# Patient Record
Sex: Female | Born: 1975 | Race: White | Hispanic: No | Smoking: Never smoker
Health system: Southern US, Community
[De-identification: ages and names within clinical notes are randomized; demographics above are authoritative.]

## PROBLEM LIST (undated history)

## (undated) DIAGNOSIS — G8929 Other chronic pain: Secondary | ICD-10-CM

## (undated) DIAGNOSIS — M549 Dorsalgia, unspecified: Secondary | ICD-10-CM

## (undated) DIAGNOSIS — R519 Headache, unspecified: Secondary | ICD-10-CM

## (undated) DIAGNOSIS — N200 Calculus of kidney: Secondary | ICD-10-CM

## (undated) DIAGNOSIS — M1990 Unspecified osteoarthritis, unspecified site: Secondary | ICD-10-CM

## (undated) DIAGNOSIS — H9319 Tinnitus, unspecified ear: Secondary | ICD-10-CM

## (undated) HISTORY — DX: Tinnitus, unspecified ear: H93.19

## (undated) HISTORY — DX: Calculus of kidney: N20.0

## (undated) HISTORY — DX: Headache, unspecified: R51.9

## (undated) HISTORY — PX: CHOLECYSTECTOMY: SHX55

## (undated) HISTORY — PX: BREAST SURGERY: SHX581

---

## 2016-02-23 DIAGNOSIS — K439 Ventral hernia without obstruction or gangrene: Secondary | ICD-10-CM | POA: Insufficient documentation

## 2016-10-21 DIAGNOSIS — R7989 Other specified abnormal findings of blood chemistry: Secondary | ICD-10-CM | POA: Insufficient documentation

## 2016-10-21 DIAGNOSIS — M47819 Spondylosis without myelopathy or radiculopathy, site unspecified: Secondary | ICD-10-CM | POA: Insufficient documentation

## 2016-10-21 DIAGNOSIS — M539 Dorsopathy, unspecified: Secondary | ICD-10-CM | POA: Insufficient documentation

## 2016-11-20 ENCOUNTER — Other Ambulatory Visit: Payer: Self-pay | Admitting: Pulmonary Disease

## 2016-11-20 DIAGNOSIS — M545 Low back pain, unspecified: Secondary | ICD-10-CM

## 2016-11-20 DIAGNOSIS — M542 Cervicalgia: Secondary | ICD-10-CM

## 2016-11-20 DIAGNOSIS — M546 Pain in thoracic spine: Secondary | ICD-10-CM

## 2016-11-20 DIAGNOSIS — R29898 Other symptoms and signs involving the musculoskeletal system: Secondary | ICD-10-CM

## 2016-11-28 ENCOUNTER — Ambulatory Visit
Admission: RE | Admit: 2016-11-28 | Discharge: 2016-11-28 | Disposition: A | Discharge status: Home / Self Care | Payer: Self-pay | Source: Ambulatory Visit | Attending: Pulmonary Disease | Admitting: Pulmonary Disease

## 2016-11-28 DIAGNOSIS — M545 Low back pain, unspecified: Secondary | ICD-10-CM

## 2016-11-28 DIAGNOSIS — R29898 Other symptoms and signs involving the musculoskeletal system: Secondary | ICD-10-CM

## 2016-12-04 ENCOUNTER — Ambulatory Visit
Admission: RE | Admit: 2016-12-04 | Discharge: 2016-12-04 | Disposition: A | Discharge status: Home / Self Care | Payer: BLUE CROSS/BLUE SHIELD | Source: Ambulatory Visit | Attending: Pulmonary Disease | Admitting: Pulmonary Disease

## 2016-12-04 DIAGNOSIS — M546 Pain in thoracic spine: Secondary | ICD-10-CM

## 2016-12-04 DIAGNOSIS — M542 Cervicalgia: Secondary | ICD-10-CM

## 2017-01-23 DIAGNOSIS — N921 Excessive and frequent menstruation with irregular cycle: Secondary | ICD-10-CM | POA: Insufficient documentation

## 2017-01-23 DIAGNOSIS — N840 Polyp of corpus uteri: Secondary | ICD-10-CM | POA: Insufficient documentation

## 2017-04-09 DIAGNOSIS — K219 Gastro-esophageal reflux disease without esophagitis: Secondary | ICD-10-CM | POA: Insufficient documentation

## 2017-04-30 DIAGNOSIS — K648 Other hemorrhoids: Secondary | ICD-10-CM | POA: Insufficient documentation

## 2017-05-01 DIAGNOSIS — H5213 Myopia, bilateral: Secondary | ICD-10-CM | POA: Insufficient documentation

## 2017-05-22 DIAGNOSIS — R768 Other specified abnormal immunological findings in serum: Secondary | ICD-10-CM | POA: Insufficient documentation

## 2017-05-22 DIAGNOSIS — B948 Sequelae of other specified infectious and parasitic diseases: Secondary | ICD-10-CM | POA: Insufficient documentation

## 2017-06-24 DIAGNOSIS — G8929 Other chronic pain: Secondary | ICD-10-CM | POA: Insufficient documentation

## 2017-06-24 DIAGNOSIS — R6889 Other general symptoms and signs: Secondary | ICD-10-CM | POA: Insufficient documentation

## 2017-11-05 DIAGNOSIS — S8002XA Contusion of left knee, initial encounter: Secondary | ICD-10-CM | POA: Insufficient documentation

## 2017-11-05 DIAGNOSIS — S0081XA Abrasion of other part of head, initial encounter: Secondary | ICD-10-CM | POA: Insufficient documentation

## 2017-11-05 DIAGNOSIS — Y990 Civilian activity done for income or pay: Secondary | ICD-10-CM | POA: Insufficient documentation

## 2017-11-05 DIAGNOSIS — S0003XA Contusion of scalp, initial encounter: Secondary | ICD-10-CM | POA: Insufficient documentation

## 2017-11-07 DIAGNOSIS — M25462 Effusion, left knee: Secondary | ICD-10-CM | POA: Insufficient documentation

## 2017-11-19 DIAGNOSIS — S83419A Sprain of medial collateral ligament of unspecified knee, initial encounter: Secondary | ICD-10-CM | POA: Insufficient documentation

## 2017-11-19 DIAGNOSIS — S83519A Sprain of anterior cruciate ligament of unspecified knee, initial encounter: Secondary | ICD-10-CM | POA: Insufficient documentation

## 2017-12-26 DIAGNOSIS — N6310 Unspecified lump in the right breast, unspecified quadrant: Secondary | ICD-10-CM | POA: Insufficient documentation

## 2017-12-26 DIAGNOSIS — R9230 Dense breasts, unspecified: Secondary | ICD-10-CM | POA: Insufficient documentation

## 2018-01-01 DIAGNOSIS — M25562 Pain in left knee: Secondary | ICD-10-CM | POA: Insufficient documentation

## 2018-02-06 DIAGNOSIS — M228X2 Other disorders of patella, left knee: Secondary | ICD-10-CM | POA: Insufficient documentation

## 2018-03-26 DIAGNOSIS — Z86010 Personal history of colonic polyps: Secondary | ICD-10-CM | POA: Insufficient documentation

## 2018-03-26 DIAGNOSIS — M2242 Chondromalacia patellae, left knee: Secondary | ICD-10-CM | POA: Insufficient documentation

## 2019-07-21 DIAGNOSIS — L509 Urticaria, unspecified: Secondary | ICD-10-CM | POA: Insufficient documentation

## 2020-04-03 DIAGNOSIS — L659 Nonscarring hair loss, unspecified: Secondary | ICD-10-CM | POA: Insufficient documentation

## 2020-04-03 DIAGNOSIS — N9089 Other specified noninflammatory disorders of vulva and perineum: Secondary | ICD-10-CM | POA: Insufficient documentation

## 2020-09-13 DIAGNOSIS — N200 Calculus of kidney: Secondary | ICD-10-CM | POA: Insufficient documentation

## 2021-01-16 DIAGNOSIS — K130 Diseases of lips: Secondary | ICD-10-CM | POA: Insufficient documentation

## 2021-02-03 ENCOUNTER — Emergency Department (HOSPITAL_BASED_OUTPATIENT_CLINIC_OR_DEPARTMENT_OTHER): Payer: BC Managed Care – PPO

## 2021-02-03 ENCOUNTER — Other Ambulatory Visit: Payer: Self-pay

## 2021-02-03 ENCOUNTER — Encounter (HOSPITAL_BASED_OUTPATIENT_CLINIC_OR_DEPARTMENT_OTHER): Payer: Self-pay | Admitting: Emergency Medicine

## 2021-02-03 ENCOUNTER — Emergency Department (HOSPITAL_BASED_OUTPATIENT_CLINIC_OR_DEPARTMENT_OTHER)
Admission: EM | Admit: 2021-02-03 | Discharge: 2021-02-03 | Disposition: A | Discharge status: Home / Self Care | Payer: BC Managed Care – PPO | Attending: Emergency Medicine | Admitting: Emergency Medicine

## 2021-02-03 DIAGNOSIS — M79662 Pain in left lower leg: Secondary | ICD-10-CM | POA: Diagnosis present

## 2021-02-03 DIAGNOSIS — U071 COVID-19: Secondary | ICD-10-CM | POA: Insufficient documentation

## 2021-02-03 DIAGNOSIS — R000 Tachycardia, unspecified: Secondary | ICD-10-CM | POA: Diagnosis not present

## 2021-02-03 DIAGNOSIS — M79605 Pain in left leg: Secondary | ICD-10-CM

## 2021-02-03 HISTORY — DX: Unspecified osteoarthritis, unspecified site: M19.90

## 2021-02-03 LAB — BASIC METABOLIC PANEL
Anion gap: 9 (ref 5–15)
BUN: 16 mg/dL (ref 6–20)
CO2: 25 mmol/L (ref 22–32)
Calcium: 9.3 mg/dL (ref 8.9–10.3)
Chloride: 103 mmol/L (ref 98–111)
Creatinine, Ser: 0.57 mg/dL (ref 0.44–1.00)
GFR, Estimated: >60 mL/min (ref >60)
Glucose, Bld: 126 mg/dL — ABNORMAL HIGH (ref 70–99)
Potassium: 3.4 mmol/L — ABNORMAL LOW (ref 3.5–5.1)
Sodium: 137 mmol/L (ref 135–145)

## 2021-02-03 LAB — CBC
HCT: 38.7 % (ref 36.0–46.0)
Hemoglobin: 13.7 g/dL (ref 12.0–15.0)
MCH: 32 pg (ref 26.0–34.0)
MCHC: 35.4 g/dL (ref 30.0–36.0)
MCV: 90.4 fL (ref 80.0–100.0)
Platelets: 236 10*3/uL (ref 150–400)
RBC: 4.28 MIL/uL (ref 3.87–5.11)
RDW: 12 % (ref 11.5–15.5)
WBC: 6.3 10*3/uL (ref 4.0–10.5)
nRBC: 0 % (ref 0.0–0.2)

## 2021-02-03 NOTE — ED Triage Notes (Signed)
Pt arrives pov with c/o L calf pain x 2 days. Pt endorses taking K+ daily x 1 week. Pt denies injury, denies swelling. Pt took 4 baby aspirin today

## 2021-02-03 NOTE — Discharge Instructions (Addendum)
Use Tylenol or ibuprofen as needed for leg pain. If you continue to have leg pain after you are done with quarantine and your COVID symptoms resolved, follow-up with your primary care doctor for reevaluation. Continue treating your COVID symptoms with over the counter medications as needed. Return to the emergency room with any new, worsening or concerning symptoms.

## 2021-02-03 NOTE — ED Provider Notes (Signed)
[facility] EMERGENCY DEPARTMENT Provider Note   CSN: [id number] Arrival date & time: 02/03/21  1204     History Chief Complaint  Patient presents with  . Leg Pain    Jaime Burch is a 45 y.o. female presenting for evaluation of left calf pain.  Patient states few days ago she started to become ill with a viral illness.  She tested positive for COVID yesterday.  Last night, she was laying in bed when she had acute onset left calf pain.  Since then, pain has been constant, increased discomfort when she dorsiflexes the foot.  No worsening pain with ambulation.  She denies history of similar.  No symptoms on the right side.  She denies previous blood clot, but states that her mom has had them before.  She is not on blood thinners.  No numbness or tingling in the foot.  She does report a history of electrolyte abnormalities, is taking potassium daily. Pt states she is here solely for evaluation of her calf pain, not her recent covid infection.   HPI     Past Medical History:  Diagnosis Date  . Arthritis     There are no problems to display for this patient.   Past Surgical History:  Procedure Laterality Date  . BREAST SURGERY    . CESAREAN SECTION    . CHOLECYSTECTOMY       OB History   No obstetric history on file.     History reviewed. No pertinent family history.  Social History   Tobacco Use  . Smoking status: Never Smoker  Substance Use Topics  . Alcohol use: Never  . Drug use: Never    Home Medications Prior to Admission medications   Not on File    Allergies    Morphine  Review of Systems   Review of Systems  Constitutional: Positive for fever.  HENT: Positive for congestion.   Respiratory: Positive for cough.   Musculoskeletal: Positive for myalgias.  All other systems reviewed and are negative.   Physical Exam Updated Vital Signs BP 130/88 (BP Location: Right Arm)   Pulse (!) 119   Temp 99.5 F (37.5 C) (Oral)   Resp 18    Ht 5\' 4"  (1.626 m)   Wt 54.4 kg   LMP 01/27/2021 (LMP Unknown)   SpO2 99%   BMI 20.60 kg/m   Physical Exam Vitals and nursing note reviewed.  Constitutional:      General: She is not in acute distress.    Appearance: She is well-developed.  HENT:     Head: Normocephalic and atraumatic.  Eyes:     Conjunctiva/sclera: Conjunctivae normal.     Pupils: Pupils are equal, round, and reactive to light.  Cardiovascular:     Rate and Rhythm: Regular rhythm. Tachycardia present.     Pulses: Normal pulses.  Pulmonary:     Effort: Pulmonary effort is normal. No respiratory distress.     Breath sounds: Normal breath sounds. No wheezing.  Abdominal:     General: There is no distension.     Palpations: Abdomen is soft. There is no mass.     Tenderness: There is no abdominal tenderness. There is no guarding or rebound.  Musculoskeletal:        General: Tenderness present. Normal range of motion.     Cervical back: Normal range of motion and neck supple.     Comments: Mild tenderness palpation of left calf.  No significant swelling.  No erythema or warmth.  Pedal pulses 2+ bilaterally.  Positive Homans on the left side.  No chest palpation of the popliteal area or upper left leg  Skin:    General: Skin is warm and dry.  Neurological:     Mental Status: She is alert and oriented to person, place, and time.     ED Results / Procedures / Treatments   Labs (all labs ordered are listed, but only abnormal results are displayed) Labs Reviewed  BASIC METABOLIC PANEL  CBC  PREGNANCY, URINE    EKG None  Radiology No results found.  Procedures Procedures   Medications Ordered in ED Medications - No data to display  ED Course  I have reviewed the triage vital signs and the nursing notes.  Pertinent labs & imaging results that were available during my care of the patient were reviewed by me and considered in my medical decision making (see chart for details).    MDM  Rules/Calculators/A&P                          Presenting for evaluation of left leg pain.  She was also recently found to be COVID-positive, but is not requesting evaluation for this.  On exam, patient appears nontoxic.  No erythema or warmth.  Mild calf tenderness.  Neurovascularly intact.  Consider DVT vs generalized body aches from infection.  Consider electrolyte abnormality.  Labs and ultrasound ordered.  Labs interpreted by me, overall reassuring.  Mild hypokalemia of 3.4, patient is already taking potassium supplementation.  Ultrasound negative for DVT.  Discussed findings with patient.  Discussed continued symptomatic treatment, follow-up with PCP as needed if pain persist after COVID infection resolves.  At this time, patient appears safe for discharge.  Return precautions given.  Patient states she understands and agrees to plan.  Final Clinical Impression(s) / ED Diagnoses Final diagnoses:  None    Rx / DC Orders ED Discharge Orders    None       Alveria Apley, PA-C 02/03/21 1533    Terrilee Files, MD 02/03/21 [phone]

## 2021-04-11 ENCOUNTER — Ambulatory Visit
Admission: EM | Admit: 2021-04-11 | Discharge: 2021-04-11 | Disposition: A | Discharge status: Home / Self Care | Payer: BC Managed Care – PPO | Attending: Emergency Medicine | Admitting: Emergency Medicine

## 2021-04-11 ENCOUNTER — Encounter: Payer: Self-pay | Admitting: Emergency Medicine

## 2021-04-11 ENCOUNTER — Other Ambulatory Visit: Payer: Self-pay

## 2021-04-11 DIAGNOSIS — H6981 Other specified disorders of Eustachian tube, right ear: Secondary | ICD-10-CM

## 2021-04-11 HISTORY — DX: Other chronic pain: G89.29

## 2021-04-11 HISTORY — DX: Other chronic pain: M54.9

## 2021-04-11 MED ORDER — AZELASTINE HCL 0.1 % NA SOLN: 2.0000 | Freq: Two times a day (BID) | NASAL | 0 refills | Status: DC

## 2021-04-11 NOTE — ED Triage Notes (Signed)
Patient c/o RT ear pain x 2 weeks.   Patient denies fever at home.   Patient endorses worsening pain with now RT sided facial pain.   Patient endorses ringing in RT ear.   Patient has used Nasonex and nasal saline with some relief of symptoms.

## 2021-04-11 NOTE — Discharge Instructions (Signed)
Follow up with PCP within the next 1 week if symptoms do not improve.

## 2021-04-11 NOTE — ED Provider Notes (Signed)
Chief Complaint   Chief Complaint  Patient presents with   Otalgia     Subjective, HPI  Jaime Burch is a very pleasant 45 y.o. female who presents with right ear pain for the last 2 weeks.  Patient states that she has also noticed some right-sided facial discomfort and ringing to the right ear.  Patient states that the pain is "aggravating".  Patient states that she has been using Nasacort and saline with some mild relief of symptoms.  No fever or recent illness reported.  No additional symptoms today.  Patient's problem list, past medical and social history, medications, and allergies were reviewed by me and updated in Epic.   ROS  See HPI.  Objective   Vitals:   04/11/21 1054  BP: 124/73  Pulse: (!) 110  Resp: 15  Temp: 99.2 F (37.3 C)  SpO2: 98%     General: Appears well-developed and well-nourished. No acute distress.  HEENT Head: Normocephalic and atraumatic.  Eyes: Conjunctivae and EOM are normal. No eye drainage or scleral icterus bilaterally.  Ears: Bilateral: Hearing grossly intact. No drainage or visible deformity. No mastoid erythema, edema, or tenderness.  Right: TM bulging with clear effusion. Left: WNL Nose: No nasal deviation. No rhinorrhea.  Mouth/Throat: No stridor or tracheal deviation. No posterior oropharyngeal erythema, edema, or exudate.  Neck: Normal range of motion, neck is supple. No cervical, tonsillar, or submandibular lymphadenopathy.  Cardiovascular: Normal rate  Pulm/Chest: No respiratory distress. No accessory muscle usage, speaking in full sentences.  Skin: Skin is warm and dry.    Vital signs and nursing note reviewed.    Assessment & Plan  1. Acute dysfunction of right eustachian tube  Meds ordered this encounter  Medications   azelastine (ASTELIN) 0.1 % nasal spray    Sig: Place 2 sprays into both nostrils 2 (two) times daily. Use in each nostril as directed    Dispense:  30 mL    Refill:  0    Order Specific Question:    Supervising Provider    Answer:   Merrilee Jansky [[id number]]    45 y.o. female presents with right ear pain for the last 2 weeks.  Patient states that she has also noticed some right-sided facial discomfort and ringing to the right ear.  Patient states that the pain is "aggravating".  Patient states that she has been using Nasacort and saline with some mild relief of symptoms.  No fever or recent illness reported.  No additional symptoms today.  Chart review completed.  Given symptoms along with assessment findings, likely eustachian tube dysfunction.  Rx'd Astelin to the patient's preferred pharmacy and advised about home treatment and care as outlined in her AVS.  Return as needed.  Patient verbalized understanding and agreed with plan.  Stable on discharge.  Plan:   Discharge Instructions      Follow up with PCP within the next 1 week if symptoms do not improve.        Amalia Greenhouse, FNP-C 04/11/21  This note was partially made with the aid of speech-to-text dictation; typographical errors are not intentional.    Amalia Greenhouse, FNP 04/11/21 1205

## 2021-04-25 DIAGNOSIS — G500 Trigeminal neuralgia: Secondary | ICD-10-CM | POA: Insufficient documentation

## 2021-04-25 DIAGNOSIS — H9313 Tinnitus, bilateral: Secondary | ICD-10-CM | POA: Insufficient documentation

## 2021-04-25 DIAGNOSIS — H9203 Otalgia, bilateral: Secondary | ICD-10-CM | POA: Insufficient documentation

## 2021-05-29 ENCOUNTER — Encounter: Payer: Self-pay | Admitting: Neurology

## 2021-05-29 ENCOUNTER — Ambulatory Visit (INDEPENDENT_AMBULATORY_CARE_PROVIDER_SITE_OTHER): Payer: BC Managed Care – PPO | Admitting: Neurology

## 2021-05-29 ENCOUNTER — Other Ambulatory Visit: Payer: Self-pay

## 2021-05-29 VITALS — BP 108/65 | HR 84 | Ht 64.0 in

## 2021-05-29 DIAGNOSIS — G43709 Chronic migraine without aura, not intractable, without status migrainosus: Secondary | ICD-10-CM | POA: Diagnosis not present

## 2021-05-29 MED ORDER — SUMATRIPTAN SUCCINATE 50 MG PO TABS: 50.0000 mg | ORAL_TABLET | ORAL | 6 refills | Status: DC | PRN

## 2021-05-29 MED ORDER — NORTRIPTYLINE HCL 25 MG PO CAPS: 50.0000 mg | ORAL_CAPSULE | Freq: Every day | ORAL | 11 refills | Status: DC

## 2021-05-29 MED ORDER — ONDANSETRON 4 MG PO TBDP: 4.0000 mg | ORAL_TABLET | Freq: Three times a day (TID) | ORAL | 6 refills | Status: DC | PRN

## 2021-05-29 NOTE — Patient Instructions (Signed)
Meds ordered this encounter  Medications   nortriptyline (PAMELOR) 25 MG capsule    Sig: Take 2 capsules (50 mg total) by mouth at bedtime.    Dispense:  60 capsule    Refill:  11   SUMAtriptan (IMITREX) 50 MG tablet    Sig: Take 1 tablet (50 mg total) by mouth every 2 (two) hours as needed for migraine. May repeat in 2 hours if headache persists or recurs.    Dispense:  12 tablet    Refill:  6   ondansetron (ZOFRAN ODT) 4 MG disintegrating tablet    Sig: Take 1 tablet (4 mg total) by mouth every 8 (eight) hours as needed for nausea or vomiting.    Dispense:  20 tablet    Refill:  6     You may combine imitrex 50mg  as needed, with zofran 4mg  +Aleve as needed for severe prolonged headaches.

## 2021-05-29 NOTE — Progress Notes (Signed)
Chief Complaint  Patient presents with   New Patient (Initial Visit)    Pt here to discuss trigeminal neuralgia, right facial pain, new room, alone, no additional questions       ASSESSMENT AND PLAN  Neiva Maenza is a 45 y.o. female   Right side facial pain, most consistent with migraine headaches  Significant improvement with amitriptyline 25 mg, will change to nortriptyline titrating to 50 mg every night as preventive medications  Imitrex 50 mg as needed, may combine with Zofran Aleve for more severe prolonged headaches  DIAGNOSTIC DATA (LABS, IMAGING, TESTING) - I reviewed patient records, labs, notes, testing and imaging myself where available.   MEDICAL HISTORY:  Glendy Barsanti, is a 45 year old female, seen in request by her primary care doctor [name].  for evaluation of right facial pain, initial evaluation was on May 29, 2021  I reviewed and summarized the referring note. PMHX. Kidney stone.  She denies a previous history of migraine headaches, on February 24, 2021, she woke up noticed sore throat, ear pain, and right side pressure, 2 weeks later, she felt more dull achy pain at the right face, felt heavy on the right side, also felt right arm, leg numbness, she drove herself to urgent care, had MRI of the brain with without contrast May 09, 2021, no significant abnormality, few small vessel disease, vascular loop abuts the mid cisternal segment of the right trigeminal nerve, and right cochlear nerve  Since then, she had intermittent right facial abnormal sensation, but denies radiating pain, she also describes right-sided headache with nausea, light noise sensitivity.  She had a history of shingles, involving right C1-C2  PHYSICAL EXAM:   Vitals:   05/29/21 0806  BP: 108/65  Pulse: 84  Height: 5\' 4"  (1.626 m)   Not recorded     Body mass index is 20.6 kg/m.  PHYSICAL EXAMNIATION:  Gen: NAD, conversant, well nourised, well groomed                      Cardiovascular: Regular rate rhythm, no peripheral edema, warm, nontender. Eyes: Conjunctivae clear without exudates or hemorrhage Neck: Supple, no carotid bruits. Pulmonary: Clear to auscultation bilaterally   NEUROLOGICAL EXAM:  MENTAL STATUS: Speech:    Speech is normal; fluent and spontaneous with normal comprehension.  Cognition:     Orientation to time, place and person     Normal recent and remote memory     Normal Attention span and concentration     Normal Language, naming, repeating,spontaneous speech     Fund of knowledge   CRANIAL NERVES: CN II: Visual fields are full to confrontation. Pupils are round equal and briskly reactive to light. CN III, IV, VI: extraocular movement are normal. No ptosis. CN V: Facial sensation is intact to light touch CN VII: Face is symmetric with normal eye closure  CN VIII: Hearing is normal to causal conversation. CN IX, X: Phonation is normal. CN XI: Head turning and shoulder shrug are intact  MOTOR: There is no pronator drift of out-stretched arms. Muscle bulk and tone are normal. Muscle strength is normal.  REFLEXES: Reflexes are 2+ and symmetric at the biceps, triceps, knees, and ankles. Plantar responses are flexor.  SENSORY: Intact to light touch, pinprick and vibratory sensation are intact in fingers and toes.  COORDINATION: There is no trunk or limb dysmetria noted.  GAIT/STANCE: Posture is normal. Gait is steady with normal steps, base, arm swing, and turning. Heel and  toe walking are normal. Tandem gait is normal.  Romberg is absent.  REVIEW OF SYSTEMS:  Full 14 system review of systems performed and notable only for as above All other review of systems were negative.   ALLERGIES: Allergies  Allergen Reactions   Morphine Other (See Comments), Shortness Of Breath and Palpitations    Other reaction(s): Hypertension (intolerance) Tachycardia, SOB Tachycardia, SOB     HOME MEDICATIONS: Current  Outpatient Medications  Medication Sig Dispense Refill   amitriptyline (ELAVIL) 25 MG tablet Take 25 mg by mouth daily.     chlorthalidone (HYGROTON) 25 MG tablet Take 25 mg by mouth daily.     Cholecalciferol 50 MCG (2000 UT) TABS Take by mouth.     Omega-3 Fatty Acids (FISH OIL PO) Take by mouth.     Potassium Bicarbonate (KLOR-CON/EF PO)      Probiotic Product (PROBIOTIC ADVANCED PO) Take by mouth.     No current facility-administered medications for this visit.    PAST MEDICAL HISTORY: Past Medical History:  Diagnosis Date   Arthritis    Chronic back pain    Headache    Kidney stones    Tinnitus     PAST SURGICAL HISTORY: Past Surgical History:  Procedure Laterality Date   BREAST SURGERY     CESAREAN SECTION     CHOLECYSTECTOMY      FAMILY HISTORY: Family History  Problem Relation Age of Onset   Breast cancer Maternal Aunt    Lupus Maternal Grandmother    Heart disease Maternal Grandfather     SOCIAL HISTORY: Social History   Socioeconomic History   Marital status: Divorced    Spouse name: Not on file   Number of children: 1   Years of education: Not on file   Highest education level: Not on file  Occupational History   Not on file  Tobacco Use   Smoking status: Never   Smokeless tobacco: Never  Substance and Sexual Activity   Alcohol use: Never   Drug use: Never   Sexual activity: Not on file  Other Topics Concern   Not on file  Social History Narrative   Not on file   Social Determinants of Health   Financial Resource Strain: Not on file  Food Insecurity: Not on file  Transportation Needs: Not on file  Physical Activity: Not on file  Stress: Not on file  Social Connections: Not on file  Intimate Partner Violence: Not on file      Levert Feinstein, M.D. Ph.D.  [facility] [address] [place], [place] [address] Ph: [phone] Fax: [phone]  CC:  Mack Hook., MD [address] [place],  [place] [address]  Jamal Collin, [place]

## 2021-06-21 ENCOUNTER — Other Ambulatory Visit: Payer: Self-pay | Admitting: *Deleted

## 2021-06-21 MED ORDER — NORTRIPTYLINE HCL 10 MG PO CAPS: 20.0000 mg | ORAL_CAPSULE | Freq: Every day | ORAL | 5 refills | Status: DC

## 2021-07-15 ENCOUNTER — Ambulatory Visit: Admit: 2021-07-15 | Payer: BC Managed Care – PPO

## 2021-08-02 MED ORDER — NORTRIPTYLINE HCL 25 MG PO CAPS: 50.0000 mg | ORAL_CAPSULE | Freq: Every day | ORAL | 11 refills | Status: DC

## 2021-08-02 NOTE — Telephone Encounter (Signed)
I spoke to the patient. She is agreeable to attempt to titrate up her dose of nortriptyline and not treat her mild, daily headaches with OTC NSAIDS. Provided the rationale behind this approach. She will only use sumatritpan for the moderate to severe pain. She will give this plan a chance. Instructed to call back with any concerns or continued issues.

## 2021-08-02 NOTE — Telephone Encounter (Signed)
Please call patient, MRI of the brain is normal, she does not have to treat every mild headache, if she overuse over-the-counter medications, she can develop medicine rebound headaches, if she tolerate current nortriptyline 20 mg every night, we can gradually titrating to 50 mg every night, use Imitrex 50 mg as needed for moderate to severe headaches only  Meds ordered this encounter  Medications   nortriptyline (PAMELOR) 25 MG capsule    Sig: Take 2 capsules (50 mg total) by mouth at bedtime.    Dispense:  60 capsule    Refill:  11

## 2021-08-24 ENCOUNTER — Encounter: Payer: Self-pay | Admitting: Neurology

## 2021-08-27 ENCOUNTER — Telehealth: Payer: Self-pay | Admitting: Neurology

## 2021-08-27 NOTE — Telephone Encounter (Signed)
Please change her follow up with me instead of NP to review MRI CD

## 2021-08-27 NOTE — Telephone Encounter (Signed)
I had to call the patient about titration of nortriptyline (documented in her mychart message). While on the phone, she was rescheduled to see Dr. Terrace Arabia.

## 2021-08-27 NOTE — Telephone Encounter (Signed)
I called the patient. She will attempt to titrate her dose up slowly. She can set the pace as tolerated. She has 10mg  capsules to allow up to move up in small increments. She has also been rescheduled to see Dr. on 09/20/21. This is to review her MRI and discuss medications.

## 2021-09-20 ENCOUNTER — Ambulatory Visit: Payer: BC Managed Care – PPO | Admitting: Neurology

## 2021-09-20 ENCOUNTER — Encounter: Payer: Self-pay | Admitting: Neurology

## 2021-09-20 ENCOUNTER — Other Ambulatory Visit: Payer: Self-pay | Admitting: Neurology

## 2021-09-20 ENCOUNTER — Other Ambulatory Visit: Payer: Self-pay

## 2021-09-20 ENCOUNTER — Other Ambulatory Visit: Payer: Self-pay | Admitting: *Deleted

## 2021-09-20 VITALS — BP 144/77 | HR 96 | Ht 64.0 in | Wt 123.0 lb

## 2021-09-20 DIAGNOSIS — G43709 Chronic migraine without aura, not intractable, without status migrainosus: Secondary | ICD-10-CM

## 2021-09-20 MED ORDER — RIZATRIPTAN BENZOATE 10 MG PO TBDP: 10.0000 mg | ORAL_TABLET | ORAL | 11 refills | Status: DC | PRN

## 2021-09-20 MED ORDER — ONDANSETRON 4 MG PO TBDP: 4.0000 mg | ORAL_TABLET | Freq: Three times a day (TID) | ORAL | 6 refills | Status: AC | PRN

## 2021-09-20 MED ORDER — AIMOVIG 70 MG/ML ~~LOC~~ SOAJ: 70.0000 mg | SUBCUTANEOUS | 11 refills | Status: DC

## 2021-09-20 MED ORDER — RIZATRIPTAN BENZOATE 10 MG PO TABS: ORAL_TABLET | ORAL | 11 refills | Status: DC

## 2021-09-20 NOTE — Progress Notes (Signed)
Chief Complaint  Patient presents with   Follow-up    Rm 14. Alone. PCP is [employer], PA-C. Review MRI scans, discuss medications. Pt c/o daily migraines.      ASSESSMENT AND PLAN  Jaime Burch is a 46 y.o. female   Chronic migraine headaches  Some improvement with nortriptyline, but even with low-dose 25+10 mg she complained side effect of dry mouth, worsening constipation,  Will taper nortriptyline down to 10 mg every night,  Add on aimovig 70 mg every 30 days as preventive medications  Suboptimal response to Imitrex, will try Maxalt 10 mg as needed, may combine with Zofran for more severe prolonged headaches  DIAGNOSTIC DATA (LABS, IMAGING, TESTING) - I reviewed patient records, labs, notes, testing and imaging myself where available.   MEDICAL HISTORY:  Jaime Burch, is a 46 year old female, seen in request by her primary care doctor Hermenia Bers.  for evaluation of right facial pain, initial evaluation was on May 29, 2021  I reviewed and summarized the referring note. PMHX. Kidney stone.  She denies a previous history of migraine headaches, on February 24, 2021, she woke up noticed sore throat, ear pain, and right side pressure, 2 weeks later, she felt more dull achy pain at the right face, felt heavy on the right side, also felt right arm, leg numbness, she drove herself to urgent care, had MRI of the brain with without contrast May 09, 2021, no significant abnormality, few small vessel disease, vascular loop abuts the mid cisternal segment of the right trigeminal nerve, and right cochlear nerve  Since then, she had intermittent right facial abnormal sensation, but denies radiating pain, she also describes right-sided headache with nausea, light noise sensitivity.  She had a history of shingles, involving right C1-C2  UPDATE Jan 5th 2023: She is not taking nortriptyline 10+25 mg every night, reported 50% improvement, much less headache, less severe,  but she continue complains of side effect with nortriptyline, dry mouth, worsening constipation,  About once a week she use Imitrex for moderate to severe headaches, which does help her much better with over-the-counter medications, only few times she has to take second dose, she does have nausea with most severe headaches, sometimes preceded by blurry vision before the onset of the headache  We personally reviewed MRI of the brain with without contrast from [facility], no acute intracranial abnormality  PHYSICAL EXAM:   Vitals:   09/20/21 1540  BP: (!) 144/77  Pulse: 96  Weight: 123 lb (55.8 kg)  Height: 5\' 4"  (1.626 m)   Not recorded     Body mass index is 21.11 kg/m.  PHYSICAL EXAMNIATION:  Gen: NAD, conversant, well nourised, well groomed                      MENTAL STATUS: Speech/cognition: Awake, alert, oriented to history taking and casual conversation CRANIAL NERVES: CN II: Visual fields are full to confrontation. Pupils are round equal and briskly reactive to light. CN III, IV, VI: extraocular movement are normal. No ptosis. CN V: Facial sensation is intact to light touch CN VII: Face is symmetric with normal eye closure  CN VIII: Hearing is normal to causal conversation. CN IX, X: Phonation is normal. CN XI: Head turning and shoulder shrug are intact  MOTOR: There is no pronator drift of out-stretched arms. Muscle bulk and tone are normal. Muscle strength is normal.  REFLEXES: Reflexes are 2+ and symmetric at the biceps, triceps, knees, and ankles.  Plantar responses are flexor.  SENSORY: Intact to light touch, pinprick and vibratory sensation are intact in fingers and toes.  COORDINATION: There is no trunk or limb dysmetria noted.  GAIT/STANCE: Posture is normal. Gait is steady with normal steps, base, arm swing, and turning. Heel and toe walking are normal. Tandem gait is normal.  Romberg is absent.  REVIEW OF SYSTEMS:  Full 14 system review of  systems performed and notable only for as above All other review of systems were negative.   ALLERGIES: Allergies  Allergen Reactions   Morphine Other (See Comments), Shortness Of Breath and Palpitations    Other reaction(s): Hypertension (intolerance) Tachycardia, SOB Tachycardia, SOB     HOME MEDICATIONS: Current Outpatient Medications  Medication Sig Dispense Refill   chlorthalidone (HYGROTON) 25 MG tablet Take 25 mg by mouth daily.     Cholecalciferol 50 MCG (2000 UT) TABS Take by mouth.     nortriptyline (PAMELOR) 10 MG capsule Take 2 capsules (20 mg total) by mouth at bedtime. 60 capsule 5   nortriptyline (PAMELOR) 25 MG capsule Take 2 capsules (50 mg total) by mouth at bedtime. 60 capsule 11   Omega-3 Fatty Acids (FISH OIL PO) Take by mouth.     ondansetron (ZOFRAN ODT) 4 MG disintegrating tablet Take 1 tablet (4 mg total) by mouth every 8 (eight) hours as needed for nausea or vomiting. 20 tablet 6   Potassium Bicarbonate (KLOR-CON/EF PO) Take 1 tablet by mouth daily.     Probiotic Product (PROBIOTIC ADVANCED PO) Take by mouth.     SUMAtriptan (IMITREX) 50 MG tablet Take 1 tablet (50 mg total) by mouth every 2 (two) hours as needed for migraine. May repeat in 2 hours if headache persists or recurs. 12 tablet 6   No current facility-administered medications for this visit.    PAST MEDICAL HISTORY: Past Medical History:  Diagnosis Date   Arthritis    Chronic back pain    Headache    Kidney stones    Tinnitus     PAST SURGICAL HISTORY: Past Surgical History:  Procedure Laterality Date   BREAST SURGERY     CESAREAN SECTION     CHOLECYSTECTOMY      FAMILY HISTORY: Family History  Problem Relation Age of Onset   Breast cancer Maternal Aunt    Lupus Maternal Grandmother    Heart disease Maternal Grandfather     SOCIAL HISTORY: Social History   Socioeconomic History   Marital status: Divorced    Spouse name: Not on file   Number of children: 1   Years of  education: Not on file   Highest education level: Not on file  Occupational History   Not on file  Tobacco Use   Smoking status: Never   Smokeless tobacco: Never  Substance and Sexual Activity   Alcohol use: Never   Drug use: Never   Sexual activity: Not on file  Other Topics Concern   Not on file  Social History Narrative   Not on file   Social Determinants of Health   Financial Resource Strain: Not on file  Food Insecurity: Not on file  Transportation Needs: Not on file  Physical Activity: Not on file  Stress: Not on file  Social Connections: Not on file  Intimate Partner Violence: Not on file      Marcial Pacas, M.D. Ph.D.  [facility] [address] [place], [place] [address] Ph: [phone] Fax: [phone]  CC:  Ardith Dark, [place]  [id number] [place],  [place] [address]  [place], [place], [place]

## 2021-09-24 ENCOUNTER — Telehealth: Payer: Self-pay

## 2021-09-24 NOTE — Telephone Encounter (Signed)
Received PA request for aimovig. Completed via CMM. Sent to [employer]. Should have a determination within 3-5 business days. Key: [id number].

## 2021-09-24 NOTE — Telephone Encounter (Signed)
Approved, Effective from 09/24/2021 through 12/16/2021.

## 2021-10-25 ENCOUNTER — Ambulatory Visit: Payer: BC Managed Care – PPO | Admitting: Neurology

## 2021-12-24 ENCOUNTER — Telehealth: Payer: Self-pay | Admitting: *Deleted

## 2021-12-24 ENCOUNTER — Other Ambulatory Visit: Payer: Self-pay | Admitting: Neurology

## 2021-12-24 NOTE — Telephone Encounter (Signed)
PA for Aimovig 70mg  started on covermymeds (key: [id number]). Pharmacy coverage through [place] [phone]). Decision pending. ?

## 2021-12-25 NOTE — Telephone Encounter (Signed)
PA approved through 12/23/2022. ?

## 2022-01-20 ENCOUNTER — Encounter: Payer: Self-pay | Admitting: Neurology

## 2022-01-21 ENCOUNTER — Other Ambulatory Visit: Payer: Self-pay | Admitting: Neurology

## 2022-01-21 MED ORDER — AIMOVIG 140 MG/ML ~~LOC~~ SOAJ: 140.0000 mg | SUBCUTANEOUS | 11 refills | Status: DC

## 2022-03-27 DIAGNOSIS — Z9189 Other specified personal risk factors, not elsewhere classified: Secondary | ICD-10-CM | POA: Insufficient documentation

## 2022-03-28 NOTE — Progress Notes (Signed)
Patient: Jaime Burch Date of Birth: 11-08-1975  Reason for Visit: Follow up History from: Patient Primary Neurologist: Dr.Yan   ASSESSMENT AND PLAN 46 y.o. year old female   1.  Chronic migraine headache  -Much improvement with Aimovig, continue 140 mg injection for migraine preventative (on higher dose since May 2023), along with nortriptyline 25 mg at bedtime -Increase Imitrex 100 mg as needed for acute headache, may combine with Zofran, Aleve for prolonged headache -Call for any dose adjustment, worsening symptoms, otherwise follow-up in 1 year  HISTORY  Jaime Burch, is a 46 year old female, seen in request by her primary care doctor Jaime Sis.  for evaluation of right facial pain, initial evaluation was on May 29, 2021   I reviewed and summarized the referring note. PMHX. Kidney stone.   She denies a previous history of migraine headaches, on February 24, 2021, she woke up noticed sore throat, ear pain, and right side pressure, 2 weeks later, she felt more dull achy pain at the right face, felt heavy on the right side, also felt right arm, leg numbness, she drove herself to urgent care, had MRI of the brain with without contrast May 09, 2021, no significant abnormality, few small vessel disease, vascular loop abuts the mid cisternal segment of the right trigeminal nerve, and right cochlear nerve  Since then, she had intermittent right facial abnormal sensation, but denies radiating pain, she also describes right-sided headache with nausea, light noise sensitivity.   She had a history of shingles, involving right C1-C2   UPDATE Jan 5th 2023: She is not taking nortriptyline 10+25 mg every night, reported 50% improvement, much less headache, less severe, but she continue complains of side effect with nortriptyline, dry mouth, worsening constipation,  About once a week she use Imitrex for moderate to severe headaches, which does help her much better with  over-the-counter medications, only few times she has to take second dose, she does have nausea with most severe headaches, sometimes preceded by blurry vision before the onset of the headache  We personally reviewed MRI of the brain with without contrast from [facility], no acute intracranial abnormality  Update April 01, 2022 SS: Aimovig was started 70 mg 09/20/21, increased to 140 mg May 2023. Things are much better. Still has some sensation of right side face feeling different, it hasn't worsened, has not had any visual change/nausea. Takes Imitrex, often 100 mg at 1 time, will help, 50 mg wasn't enough. Laying down helps. Still on nortriptyline 25 mg, is able to tolerate. Hasn't needed Imitrex since May.   REVIEW OF SYSTEMS: Out of a complete 14 system review of symptoms, the patient complains only of the following symptoms, and all other reviewed systems are negative.  See HPI  ALLERGIES: Allergies  Allergen Reactions   Morphine Other (See Comments), Shortness Of Breath and Palpitations    Other reaction(s): Hypertension (intolerance) Tachycardia, SOB Tachycardia, SOB     HOME MEDICATIONS: Outpatient Medications Prior to Visit  Medication Sig Dispense Refill   chlorthalidone (HYGROTON) 25 MG tablet Take 25 mg by mouth daily.     Omega-3 Fatty Acids (FISH OIL PO) Take by mouth.     ondansetron (ZOFRAN ODT) 4 MG disintegrating tablet Take 1 tablet (4 mg total) by mouth every 8 (eight) hours as needed for nausea or vomiting. 20 tablet 6   ondansetron (ZOFRAN-ODT) 4 MG disintegrating tablet Take 1 tablet (4 mg total) by mouth every 8 (eight) hours as needed for nausea or  vomiting. 20 tablet 6   Potassium Bicarbonate (KLOR-CON/EF PO) Take 1 tablet by mouth daily. 60 mg     Probiotic Product (PROBIOTIC ADVANCED PO) Take by mouth.     Erenumab-aooe (AIMOVIG) 140 MG/ML SOAJ Inject 140 mg into the skin every 30 (thirty) days. 1.12 mL 11   nortriptyline (PAMELOR) 25 MG capsule Take 2  capsules (50 mg total) by mouth at bedtime. 60 capsule 11   SUMAtriptan (IMITREX) 50 MG tablet Take 1 tablet (50 mg total) by mouth every 2 (two) hours as needed for migraine. May repeat in 2 hours if headache persists or recurs. 12 tablet 6   rizatriptan (MAXALT) 10 MG tablet Take 1 tab at onset of migraine.  May repeat in 2 hrs, if needed.  Max dose: 2 tabs/day. This is a 30 day prescription. 10 tablet 11   rizatriptan (MAXALT-MLT) 10 MG disintegrating tablet Take 1 tablet (10 mg total) by mouth as needed for migraine. May repeat in 2 hours if needed 10 tablet 11   No facility-administered medications prior to visit.    PAST MEDICAL HISTORY: Past Medical History:  Diagnosis Date   Arthritis    Chronic back pain    Headache    Kidney stones    Tinnitus     PAST SURGICAL HISTORY: Past Surgical History:  Procedure Laterality Date   BREAST SURGERY     CESAREAN SECTION     CHOLECYSTECTOMY      FAMILY HISTORY: Family History  Problem Relation Age of Onset   Breast cancer Maternal Aunt    Lupus Maternal Grandmother    Heart disease Maternal Grandfather     SOCIAL HISTORY: Social History   Socioeconomic History   Marital status: Divorced    Spouse name: Not on file   Number of children: 1   Years of education: Not on file   Highest education level: Not on file  Occupational History   Not on file  Tobacco Use   Smoking status: Never   Smokeless tobacco: Never  Substance and Sexual Activity   Alcohol use: Never   Drug use: Never   Sexual activity: Not on file  Other Topics Concern   Not on file  Social History Narrative   Not on file   Social Determinants of Health   Financial Resource Strain: Not on file  Food Insecurity: Not on file  Transportation Needs: Not on file  Physical Activity: Not on file  Stress: Not on file  Social Connections: Not on file  Intimate Partner Violence: Not on file   PHYSICAL EXAM  Vitals:   04/02/22 1039  BP: 125/73  Pulse:  91  Weight: 122 lb (55.3 kg)  Height: 5\' 4"  (1.626 m)   Body mass index is 20.94 kg/m.  Generalized: Well developed, in no acute distress  Neurological examination  Mentation: Alert oriented to time, place, history taking. Follows all commands speech and language fluent Cranial nerve II-XII: Pupils were equal round reactive to light. Extraocular movements were full, visual field were full on confrontational test. Facial sensation and strength were normal. Head turning and shoulder shrug  were normal and symmetric. Motor: The motor testing reveals 5 over 5 strength of all 4 extremities. Good symmetric motor tone is noted throughout.  Sensory: Sensory testing is intact to soft touch on all 4 extremities. No evidence of extinction is noted.  Right V1 feels "different" Coordination: Cerebellar testing reveals good finger-nose-finger and heel-to-shin bilaterally.  Gait and station: Gait is normal. Tandem  gait is normal.  Reflexes: Deep tendon reflexes are symmetric and normal bilaterally.   DIAGNOSTIC DATA (LABS, IMAGING, TESTING) - I reviewed patient records, labs, notes, testing and imaging myself where available.  Lab Results  Component Value Date   WBC 6.3 02/03/2021   HGB 13.7 02/03/2021   HCT 38.7 02/03/2021   MCV 90.4 02/03/2021   PLT 236 02/03/2021      Component Value Date/Time   NA 137 02/03/2021 1309   K 3.4 (L) 02/03/2021 1309   CL 103 02/03/2021 1309   CO2 25 02/03/2021 1309   GLUCOSE 126 (H) 02/03/2021 1309   BUN 16 02/03/2021 1309   CREATININE 0.57 02/03/2021 1309   CALCIUM 9.3 02/03/2021 1309   GFRNONAA >60 02/03/2021 1309   No results found for: "CHOL", "HDL", "LDLCALC", "LDLDIRECT", "TRIG", "CHOLHDL" No results found for: "HGBA1C" No results found for: "VITAMINB12" No results found for: "TSH"  Margie Ege, AGNP-C, DNP 04/02/2022, 12:16 PM [facility] [address], [address] [place], [place] [address] [phone]

## 2022-04-02 ENCOUNTER — Encounter: Payer: Self-pay | Admitting: Neurology

## 2022-04-02 ENCOUNTER — Ambulatory Visit: Payer: BC Managed Care – PPO | Admitting: Neurology

## 2022-04-02 VITALS — BP 125/73 | HR 91 | Ht 64.0 in | Wt 122.0 lb

## 2022-04-02 DIAGNOSIS — G43709 Chronic migraine without aura, not intractable, without status migrainosus: Secondary | ICD-10-CM

## 2022-04-02 MED ORDER — NORTRIPTYLINE HCL 25 MG PO CAPS: 25.0000 mg | ORAL_CAPSULE | Freq: Every day | ORAL | 3 refills | Status: DC

## 2022-04-02 MED ORDER — AIMOVIG 140 MG/ML ~~LOC~~ SOAJ: 140.0000 mg | SUBCUTANEOUS | 11 refills | Status: DC

## 2022-04-02 MED ORDER — SUMATRIPTAN SUCCINATE 100 MG PO TABS: 100.0000 mg | ORAL_TABLET | ORAL | 11 refills | Status: DC | PRN

## 2022-04-02 NOTE — Patient Instructions (Signed)
Meds ordered this encounter  Medications   SUMAtriptan (IMITREX) 100 MG tablet    Sig: Take 1 tablet (100 mg total) by mouth every 2 (two) hours as needed for migraine. May repeat in 2 hours if headache persists or recurs.    Dispense:  10 tablet    Refill:  11   Erenumab-aooe (AIMOVIG) 140 MG/ML SOAJ    Sig: Inject 140 mg into the skin every 30 (thirty) days.    Dispense:  1.12 mL    Refill:  11   nortriptyline (PAMELOR) 25 MG capsule    Sig: Take 1 capsule (25 mg total) by mouth at bedtime.    Dispense:  90 capsule    Refill:  3

## 2022-04-12 DIAGNOSIS — E7849 Other hyperlipidemia: Secondary | ICD-10-CM | POA: Insufficient documentation

## 2022-04-15 DIAGNOSIS — Z0000 Encounter for general adult medical examination without abnormal findings: Secondary | ICD-10-CM | POA: Insufficient documentation

## 2022-04-30 DIAGNOSIS — L280 Lichen simplex chronicus: Secondary | ICD-10-CM | POA: Insufficient documentation

## 2022-04-30 DIAGNOSIS — L292 Pruritus vulvae: Secondary | ICD-10-CM | POA: Insufficient documentation

## 2022-08-07 ENCOUNTER — Other Ambulatory Visit: Payer: Self-pay | Admitting: Neurology

## 2022-08-24 IMAGING — US US EXTREM LOW VENOUS*L*
1 series · 13 of 24 positions shown · non-contrast
Comparison: None.

CLINICAL DATA: Left calf pain, COVID positive



[Series 1: us extrem low venous*left* · 13 of 39 slices shown]
[im 1/39]
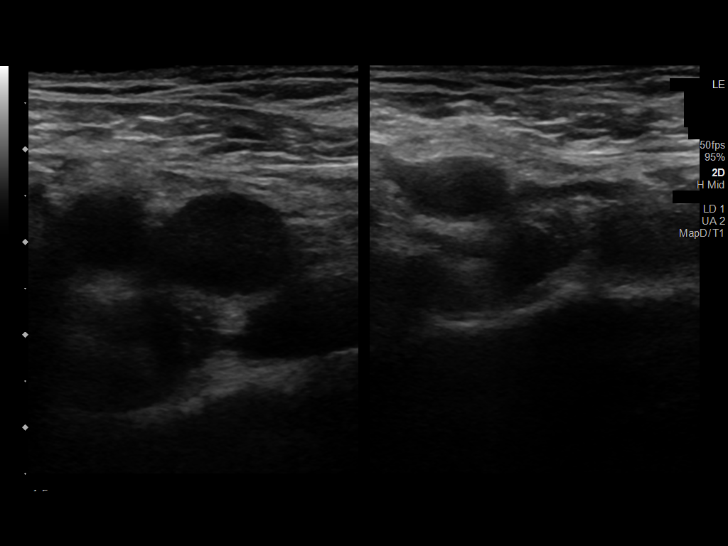
[im 4/39]
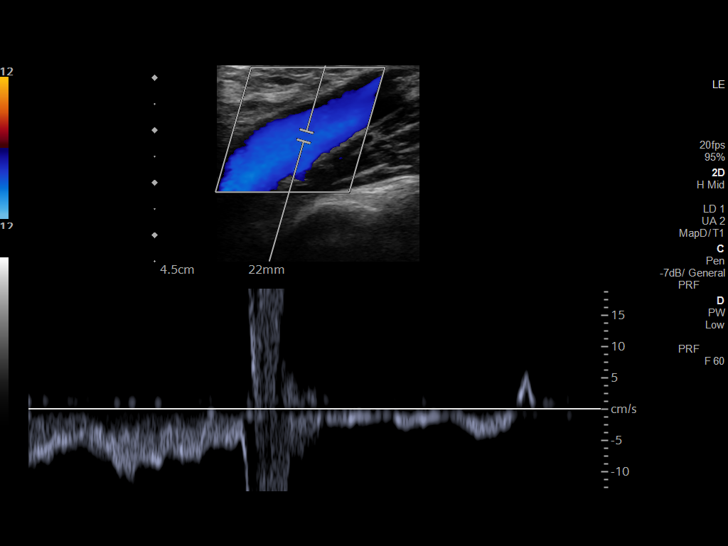
[im 7/39]
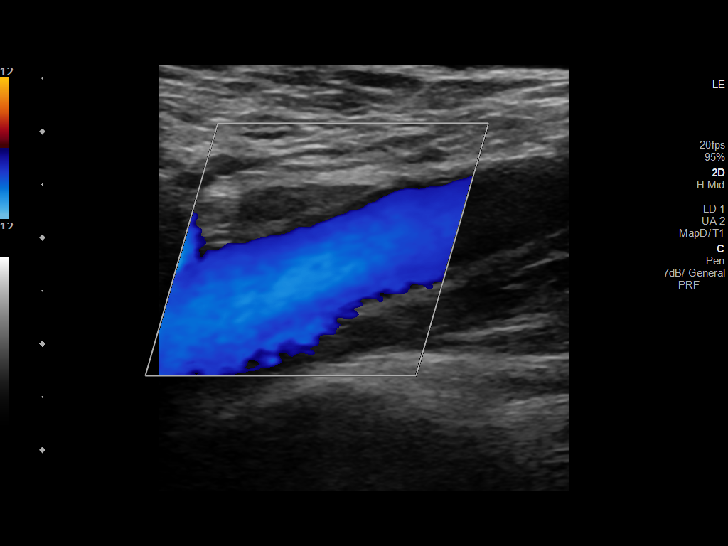
[im 10/39]
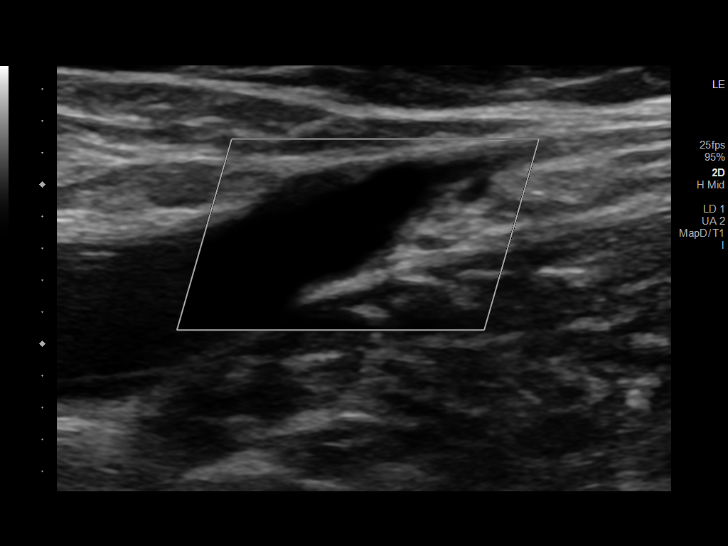
[im 14/39]
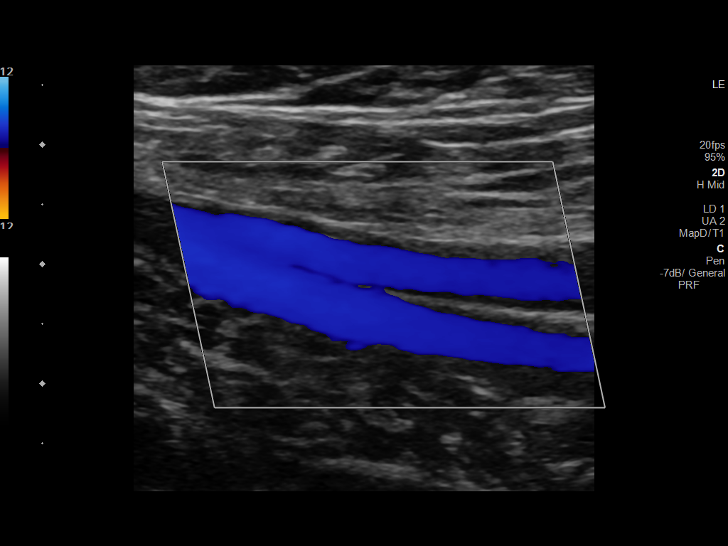
[im 17/39]
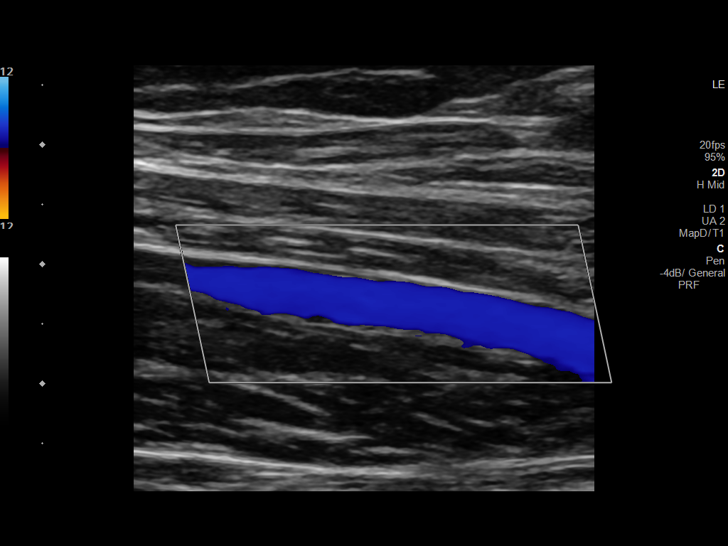
[im 20/39]
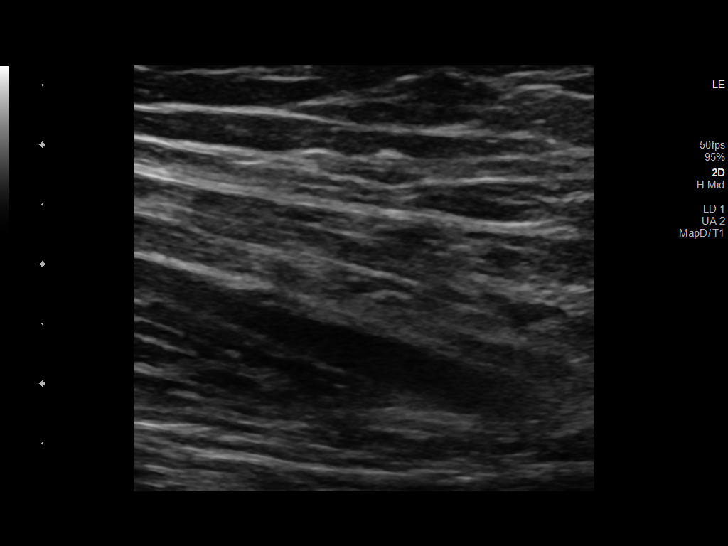
[im 22/39]
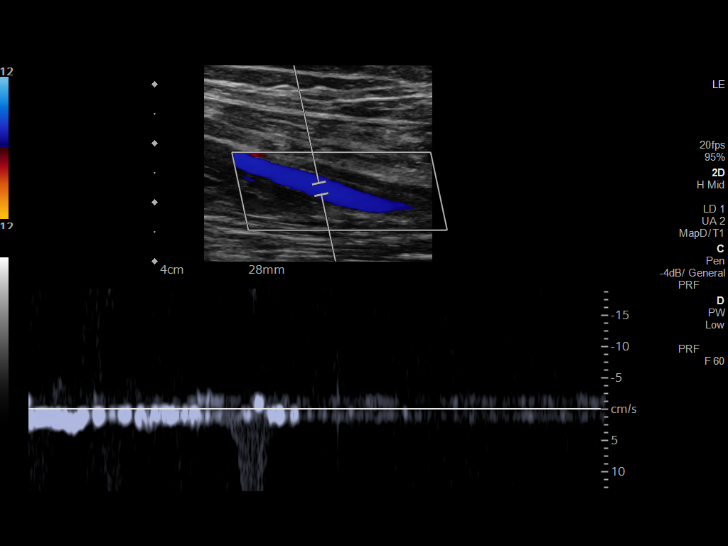
[im 25/39]
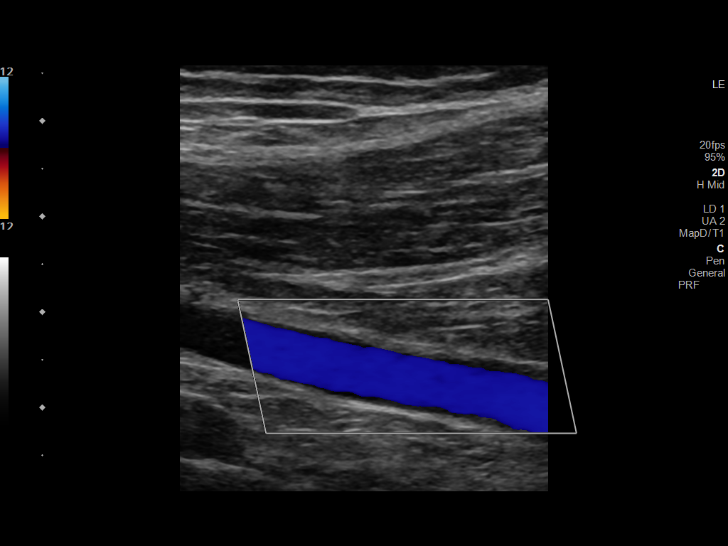
[im 29/39]
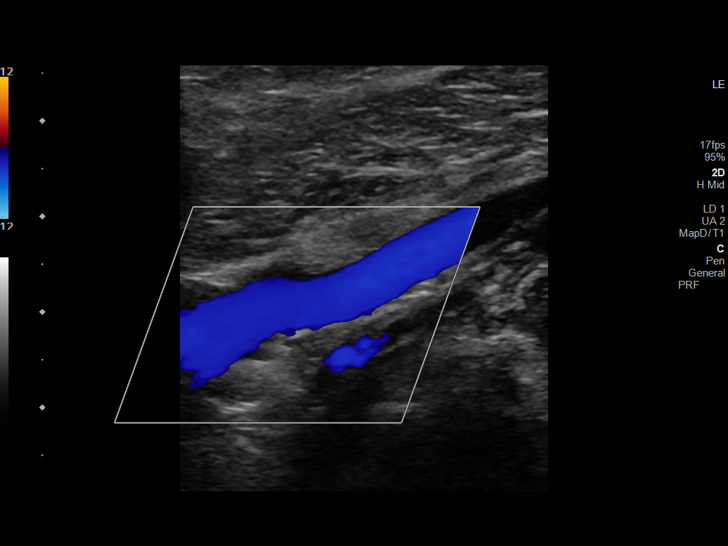
[im 32/39]
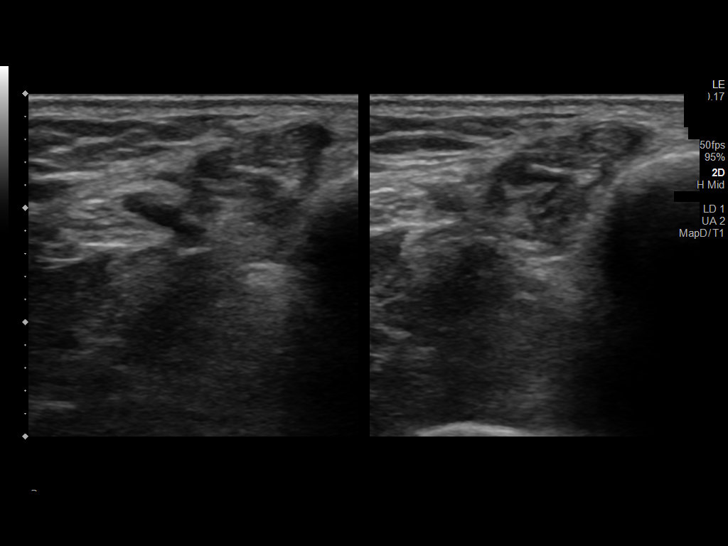
[im 35/39]
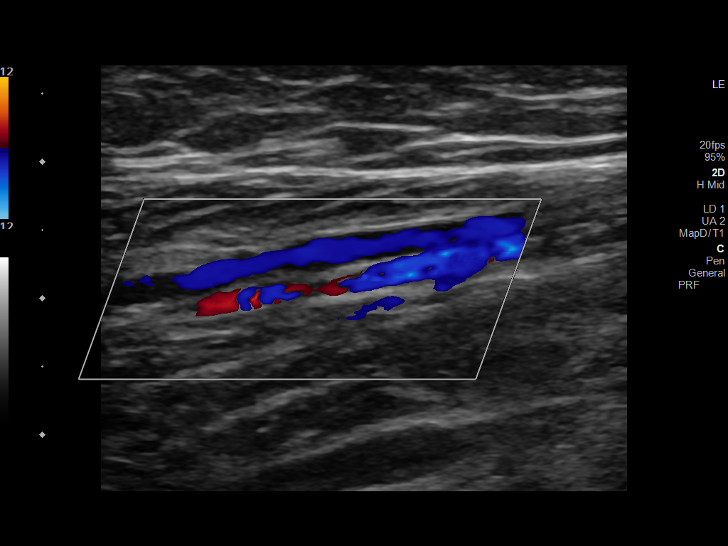
[im 39/39]
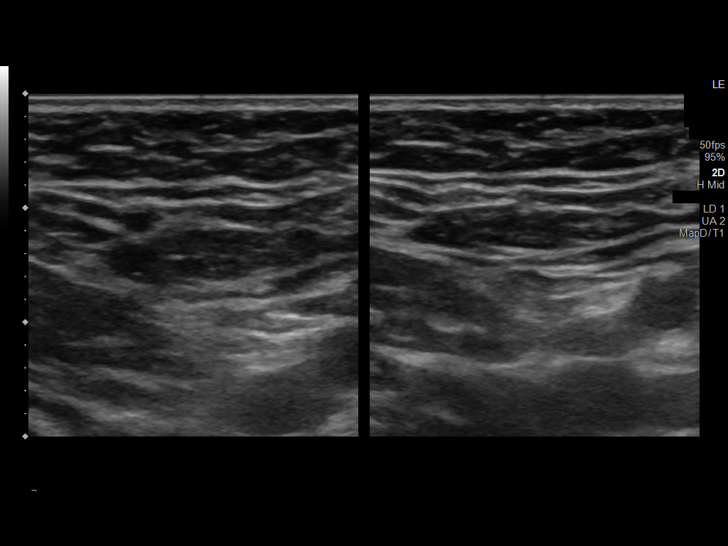

[13 of 24 positions shown; findings below may reference images not displayed]

FINDINGS: Contralateral Common Femoral Vein: Respiratory phasicity is normal
and symmetric with the symptomatic side. No evidence of thrombus.
Normal compressibility.

Common Femoral Vein: No evidence of thrombus. Normal
compressibility, respiratory phasicity and response to augmentation.

Saphenofemoral Junction: No evidence of thrombus. Normal
compressibility and flow on color Doppler imaging.

Profunda Femoral Vein: No evidence of thrombus. Normal
compressibility and flow on color Doppler imaging.

Femoral Vein: No evidence of thrombus. Normal compressibility,
respiratory phasicity and response to augmentation.

Popliteal Vein: No evidence of thrombus. Normal compressibility,
respiratory phasicity and response to augmentation.

Calf Veins: No evidence of thrombus. Normal compressibility and flow
on color Doppler imaging.
IMPRESSION: No evidence of deep venous thrombosis.

## 2022-10-03 ENCOUNTER — Encounter: Payer: Self-pay | Admitting: Neurology

## 2022-10-03 MED ORDER — AJOVY 225 MG/1.5ML ~~LOC~~ SOAJ: 225.0000 mg | SUBCUTANEOUS | 11 refills | Status: DC

## 2022-10-03 NOTE — Addendum Note (Signed)
Addended by: Suzzanne Cloud on: 10/03/2022 04:39 PM   Modules accepted: Orders

## 2022-10-16 ENCOUNTER — Telehealth: Payer: Self-pay

## 2022-10-16 NOTE — Telephone Encounter (Signed)
PA needed for Ajovy.

## 2022-10-17 ENCOUNTER — Other Ambulatory Visit (HOSPITAL_COMMUNITY): Payer: Self-pay

## 2022-10-17 NOTE — Telephone Encounter (Signed)
Pharmacy Patient Advocate Encounter   Received notification from [place] that prior authorization for Ajovy Injection 225 MG/ 1.5 ML is required/requested.    PA submitted on 10/17/2022 to (ins) [employer] [place] [employer] via [employer] Status is pending

## 2022-10-22 NOTE — Telephone Encounter (Signed)
PA still pending.  

## 2022-10-25 NOTE — Telephone Encounter (Signed)
PA has been DENIED.   Denial letter has been attached in patient documents.

## 2022-11-08 ENCOUNTER — Encounter: Payer: Self-pay | Admitting: Neurology

## 2022-11-13 NOTE — Telephone Encounter (Signed)
Faxed Appeal letter to [facility]

## 2022-12-16 DIAGNOSIS — Z3202 Encounter for pregnancy test, result negative: Secondary | ICD-10-CM | POA: Insufficient documentation

## 2022-12-16 DIAGNOSIS — N939 Abnormal uterine and vaginal bleeding, unspecified: Secondary | ICD-10-CM | POA: Insufficient documentation

## 2022-12-16 HISTORY — PX: OTHER SURGICAL HISTORY: SHX169

## 2022-12-16 HISTORY — PX: DILATION AND CURETTAGE OF UTERUS: SHX78

## 2022-12-22 ENCOUNTER — Encounter: Payer: Self-pay | Admitting: Neurology

## 2022-12-23 MED ORDER — NURTEC 75 MG PO TBDP: 75.0000 mg | ORAL_TABLET | ORAL | 11 refills | Status: DC

## 2022-12-23 NOTE — Addendum Note (Signed)
Addended by: Glean Salvo on: 12/23/2022 09:04 PM   Modules accepted: Orders

## 2022-12-23 NOTE — Telephone Encounter (Signed)
  This is the formulary, Ajovy was denied, wasn't sure if maybe you wanted to try something else. It will likely still need a PA but will have a good chance of getting approved since its on formulary.

## 2022-12-24 ENCOUNTER — Other Ambulatory Visit: Payer: Self-pay | Admitting: Neurology

## 2022-12-25 ENCOUNTER — Encounter (INDEPENDENT_AMBULATORY_CARE_PROVIDER_SITE_OTHER): Payer: BC Managed Care – PPO | Admitting: Neurology

## 2022-12-25 DIAGNOSIS — G43709 Chronic migraine without aura, not intractable, without status migrainosus: Secondary | ICD-10-CM | POA: Diagnosis not present

## 2022-12-25 MED ORDER — EMGALITY 120 MG/ML ~~LOC~~ SOAJ: 1.0000 | SUBCUTANEOUS | 11 refills | Status: DC

## 2022-12-25 NOTE — Telephone Encounter (Signed)
Stopping Aimovig, tried Ajovy but insurance denied. Will try Emgality now. She talked with pharmacy felt difficult to get Nurtec approval as well, I cancelled. Will try Emgality.

## 2022-12-25 NOTE — Telephone Encounter (Signed)
PA for [facility] requested PA status is pending  Submitted through [facility] MEB:[id number] ID : [id number]

## 2023-01-03 DIAGNOSIS — Z9889 Other specified postprocedural states: Secondary | ICD-10-CM | POA: Insufficient documentation

## 2023-01-08 NOTE — Telephone Encounter (Signed)
Please see the MyChart message reply(ies) for my assessment and plan.    This patient gave consent for this Medical Advice Message and is aware that it may result in a bill to [employer], as well as the possibility of receiving a bill for a co-payment or deductible. They are an established patient, but are not seeking medical advice exclusively about a problem treated during an in person or video visit in the last seven days. I did not recommend an in person or video visit within seven days of my reply.    I spent a total of 6 minutes cumulative time within 7 days through [employer].  Glean Salvo, NP

## 2023-01-11 ENCOUNTER — Emergency Department (HOSPITAL_BASED_OUTPATIENT_CLINIC_OR_DEPARTMENT_OTHER)
Admission: EM | Admit: 2023-01-11 | Discharge: 2023-01-11 | Disposition: A | Discharge status: Home / Self Care | Payer: BC Managed Care – PPO | Attending: Emergency Medicine | Admitting: Emergency Medicine

## 2023-01-11 ENCOUNTER — Emergency Department (HOSPITAL_BASED_OUTPATIENT_CLINIC_OR_DEPARTMENT_OTHER): Payer: BC Managed Care – PPO

## 2023-01-11 ENCOUNTER — Telehealth: Payer: Self-pay | Admitting: Neurology

## 2023-01-11 ENCOUNTER — Encounter (HOSPITAL_BASED_OUTPATIENT_CLINIC_OR_DEPARTMENT_OTHER): Payer: Self-pay | Admitting: Emergency Medicine

## 2023-01-11 ENCOUNTER — Other Ambulatory Visit: Payer: Self-pay

## 2023-01-11 DIAGNOSIS — R200 Anesthesia of skin: Secondary | ICD-10-CM

## 2023-01-11 DIAGNOSIS — R202 Paresthesia of skin: Secondary | ICD-10-CM | POA: Insufficient documentation

## 2023-01-11 DIAGNOSIS — G43809 Other migraine, not intractable, without status migrainosus: Secondary | ICD-10-CM | POA: Diagnosis not present

## 2023-01-11 DIAGNOSIS — R519 Headache, unspecified: Secondary | ICD-10-CM | POA: Diagnosis present

## 2023-01-11 LAB — COMPREHENSIVE METABOLIC PANEL
ALT: 10 U/L (ref 0–44)
AST: 15 U/L (ref 15–41)
Albumin: 4.1 g/dL (ref 3.5–5.0)
Alkaline Phosphatase: 32 U/L — ABNORMAL LOW (ref 38–126)
Anion gap: 9 (ref 5–15)
BUN: 8 mg/dL (ref 6–20)
CO2: 28 mmol/L (ref 22–32)
Calcium: 9.3 mg/dL (ref 8.9–10.3)
Chloride: 103 mmol/L (ref 98–111)
Creatinine, Ser: 0.54 mg/dL (ref 0.44–1.00)
GFR, Estimated: >60 mL/min (ref >60)
Glucose, Bld: 100 mg/dL — ABNORMAL HIGH (ref 70–99)
Potassium: 3.2 mmol/L — ABNORMAL LOW (ref 3.5–5.1)
Sodium: 140 mmol/L (ref 135–145)
Total Bilirubin: 0.4 mg/dL (ref 0.3–1.2)
Total Protein: 6.3 g/dL — ABNORMAL LOW (ref 6.5–8.1)

## 2023-01-11 LAB — CBC WITH DIFFERENTIAL/PLATELET
Abs Immature Granulocytes: 0 10*3/uL (ref 0.00–0.07)
Basophils Absolute: 0.1 10*3/uL (ref 0.0–0.1)
Basophils Relative: 1 %
Eosinophils Absolute: 0 10*3/uL (ref 0.0–0.5)
Eosinophils Relative: 1 %
HCT: 38.5 % (ref 36.0–46.0)
Hemoglobin: 13.3 g/dL (ref 12.0–15.0)
Immature Granulocytes: 0 %
Lymphocytes Relative: 47 %
Lymphs Abs: 2 10*3/uL (ref 0.7–4.0)
MCH: 31 pg (ref 26.0–34.0)
MCHC: 34.5 g/dL (ref 30.0–36.0)
MCV: 89.7 fL (ref 80.0–100.0)
Monocytes Absolute: 0.5 10*3/uL (ref 0.1–1.0)
Monocytes Relative: 10 %
Neutro Abs: 1.8 10*3/uL (ref 1.7–7.7)
Neutrophils Relative %: 41 %
Platelets: 206 10*3/uL (ref 150–400)
RBC: 4.29 MIL/uL (ref 3.87–5.11)
RDW: 11.7 % (ref 11.5–15.5)
WBC: 4.4 10*3/uL (ref 4.0–10.5)
nRBC: 0 % (ref 0.0–0.2)

## 2023-01-11 LAB — PREGNANCY, URINE: Preg Test, Ur: NEGATIVE

## 2023-01-11 MED ORDER — DIPHENHYDRAMINE HCL 50 MG/ML IJ SOLN
25.0000 mg | Freq: Once | INTRAMUSCULAR | Status: AC
  Administered 2023-01-11: 25 mg via INTRAVENOUS
  Filled 2023-01-11: qty 1

## 2023-01-11 MED ORDER — PROCHLORPERAZINE EDISYLATE 10 MG/2ML IJ SOLN
10.0000 mg | Freq: Four times a day (QID) | INTRAMUSCULAR | Status: DC | PRN
  Administered 2023-01-11: 10 mg via INTRAVENOUS
  Filled 2023-01-11: qty 2

## 2023-01-11 NOTE — ED Notes (Signed)
Patient transported to CT 

## 2023-01-11 NOTE — ED Triage Notes (Signed)
Pt has hx migraines, pt is trying different medications as her prior regime stopped working, pt notes she did start tamoxifen as preventative for breast cancer in oct, and stopped this week due to uterine polyp.pt reports that she woke this am out of sleep with severe headache and felt tingling to her right arm and foot, lasted about 30 minutes. She said it passed. Her usual symptoms with migraine has been right arm tingly but not the foot. Neurologist suggested she come in and be evaluated to possibly to rule out stroke.

## 2023-01-11 NOTE — ED Notes (Signed)
Tequila with cl called for ed to ed to [facility], dr Durwin Nora accepting

## 2023-01-11 NOTE — ED Notes (Signed)
Pt declined transport to [facility] for MRI, Schlossman MD aware, pt to be discharged

## 2023-01-11 NOTE — ED Provider Notes (Signed)
[place] EMERGENCY DEPARTMENT AT [facility] Provider Note   CSN: [id number] Arrival date & time: 01/11/23  1044     History  Chief Complaint  Patient presents with   Migraine    Jaime Burch is a 47 y.o. female.  HPI     47yo female with history of chronic migraine headache she sees neurology, nephrolithiasis, recent hysteroscopy, D&C who presents with concern for headache with numbness/tingling/burning on the right side.  Woke up at 1AM on the right side with throbbing right sided headache, numbness/tingling/burning of right side, arm leg, right buttock.  Right side of face always feels different  Has had headache constantly for several weeks For the past 7 weeks off of amovig, waiting for approval for emgality, now just on nortriptyline and neurtec 75  Previously declined the MRI after had episode right hand tingling 3 weeks ago, decided to try different medication first  Nausea Not necessarily worse with bright lights, loud sounds but doesn't help, does work and at home keep lights lower  Headache now is 4/10, nausea, no vomiting  No weakness, no drooping of the face, difficulyt talking or walking   Blurred vision is one of migraine symptoms, has had it constantly blurry for one week, had d and c since Friday and worse since then. Not double, just blurred. No family hx of MS or strokes No smoking, other drugs, etoh  Chlorthalidone and k for kidney stones.   Past Medical History:  Diagnosis Date   Arthritis    Chronic back pain    Headache    migraine   Kidney stones    Tinnitus      Home Medications Prior to Admission medications   Medication Sig Start Date End Date Taking? Authorizing Provider  chlorthalidone (HYGROTON) 25 MG tablet Take 25 mg by mouth daily. 03/30/21   [provider]  Galcanezumab-gnlm (EMGALITY) 120 MG/ML SOAJ Inject 1 Pen into the skin every 28 (twenty-eight) days. 12/25/22   Glean Salvo, NP  nortriptyline  (PAMELOR) 25 MG capsule Take 1 capsule (25 mg total) by mouth at bedtime. 08/07/22   Glean Salvo, NP  Omega-3 Fatty Acids (FISH OIL PO) Take by mouth.    [provider]  ondansetron (ZOFRAN ODT) 4 MG disintegrating tablet Take 1 tablet (4 mg total) by mouth every 8 (eight) hours as needed for nausea or vomiting. 05/29/21   Levert Feinstein, MD  ondansetron (ZOFRAN-ODT) 4 MG disintegrating tablet Take 1 tablet (4 mg total) by mouth every 8 (eight) hours as needed for nausea or vomiting. 09/20/21   Levert Feinstein, MD  Potassium Bicarbonate (KLOR-CON/EF PO) Take 1 tablet by mouth daily. 60 mg 03/30/21   [provider]  Probiotic Product (PROBIOTIC ADVANCED PO) Take by mouth.    [provider]  SUMAtriptan (IMITREX) 100 MG tablet Take 1 tablet (100 mg total) by mouth every 2 (two) hours as needed for migraine. May repeat in 2 hours if headache persists or recurs. 04/02/22   Glean Salvo, NP      Allergies    Morphine    Review of Systems   Review of Systems  Physical Exam Updated Vital Signs BP 117/74   Pulse 82   Temp 98.6 F (37 C) (Oral)   Resp 14   SpO2 100%  Physical Exam Vitals and nursing note reviewed.  Constitutional:      General: She is not in acute distress.    Appearance: Normal appearance. She is well-developed. She  is not ill-appearing or diaphoretic.  HENT:     Head: Normocephalic and atraumatic.  Eyes:     General: No visual field deficit.    Extraocular Movements: Extraocular movements intact.     Conjunctiva/sclera: Conjunctivae normal.     Pupils: Pupils are equal, round, and reactive to light.  Cardiovascular:     Rate and Rhythm: Normal rate and regular rhythm.     Pulses: Normal pulses.     Heart sounds: Normal heart sounds. No murmur heard.    No friction rub. No gallop.  Pulmonary:     Effort: Pulmonary effort is normal. No respiratory distress.     Breath sounds: Normal breath sounds. No wheezing or rales.  Abdominal:     General:  There is no distension.     Palpations: Abdomen is soft.     Tenderness: There is no abdominal tenderness. There is no guarding.  Musculoskeletal:        General: No swelling or tenderness.     Cervical back: Normal range of motion.  Skin:    General: Skin is warm and dry.     Findings: No erythema or rash.  Neurological:     General: No focal deficit present.     Mental Status: She is alert and oriented to person, place, and time.     GCS: GCS eye subscore is 4. GCS verbal subscore is 5. GCS motor subscore is 6.     Cranial Nerves: No cranial nerve deficit, dysarthria or facial asymmetry.     Sensory: No sensory deficit (reports the lateral part of right leg below knee feels "maybe different").     Motor: No weakness or tremor.     Coordination: Coordination normal. Finger-Nose-Finger Test normal.     Gait: Gait normal.     ED Results / Procedures / Treatments   Labs (all labs ordered are listed, but only abnormal results are displayed) Labs Reviewed  COMPREHENSIVE METABOLIC PANEL - Abnormal; Notable for the following components:      Result Value   Potassium 3.2 (*)    Glucose, Bld 100 (*)    Total Protein 6.3 (*)    Alkaline Phosphatase 32 (*)    All other components within normal limits  CBC WITH DIFFERENTIAL/PLATELET  PREGNANCY, URINE    EKG EKG Interpretation  Date/Time:  Saturday January 11 2023 13:46:46 EDT Ventricular Rate:  80 PR Interval:  148 QRS Duration: 70 QT Interval:  371 QTC Calculation: 428 R Axis:   82 Text Interpretation: Sinus rhythm No previous ECGs available Confirmed by Alvira Monday ([id number]) on 01/11/2023 3:40:32 PM  Radiology No results found.  Procedures Procedures    Medications Ordered in ED Medications  prochlorperazine (COMPAZINE) injection 10 mg (10 mg Intravenous Given 01/11/23 1458)  diphenhydrAMINE (BENADRYL) injection 25 mg (25 mg Intravenous Given 01/11/23 1458)    ED Course/ Medical Decision Making/ A&P                                47yo female with history of chronic migraine headache she sees neurology, nephrolithiasis, recent hysteroscopy, D&C who presents with concern for headache with numbness/tingling/burning on the right side.  DDx includes ICH, CVA, mass, MS, electrolyte abnormality, focal seizure, complicated migraine.   Labs completed and personally about interpreted by me show no acute clinically significant electrolyte abnormalities, no anemia, no leukocytosis.  EKG completed and evaluated by me shows normal sinus rhythm.  Her current neurologic exam is normal with thte exception of one part of lower leg feels "off".  She does not have risk factors for CVA, and given burning/tingling component do not feel she requires TIA admission.  Did initially discuss transfer for MRI [facility] to evaluate for signs of MS/CVA/other.  Discussed this would be conservative to make sure we re not missing another cause, however complicated migraine given her migraine history and positive symptoms is high on differential and it is not unreasonable to follow up closely with her Neurologist for further discussion/MRI given no current neurologic symptoms.   CT head completed and personally about interpreted by me and radiology shows no acute bleed or other intracranial abnormality.  She continues to not have any tingling/numbness. She has akathisia from compazine however reports headache is improved.  Recommend close follow up with Neurology. Discussed reasons to return. Patient discharged in stable condition with understanding of reasons to return.            Final Clinical Impression(s) / ED Diagnoses Final diagnoses:  Other migraine without status migrainosus, not intractable  Numbness and tingling of right arm and leg    Rx / DC Orders ED Discharge Orders     None         Alvira Monday, MD 01/11/23 [phone]

## 2023-01-11 NOTE — ED Notes (Signed)
RN reviewed discharge instructions with pt. Pt verbalized understanding and had no further questions. VSS upon discharge.  

## 2023-01-11 NOTE — Telephone Encounter (Signed)
Patient sent a message through the after-hours call service regarding her migraine.  I was able to speak to her.  She reported that for the past several weeks she has had increased in her migraine frequency as she has been off of the Aimovig injections.  She is still waiting for prior authorization for a newer injectable drug, she has not heard anything yet and it has been over 2 and half weeks.  She has been taking Nurtec every other day for prevention but it has not been helpful.  She woke up in the middle of the night with tingling in the right upper and lower extremity.  She checked for any facial droop, did not feel weakness, symptoms lasted for about half an hour and she did have a headache which was throbbing.  She has had 1 recent occasion of right arm tingling.  She was not sure how to proceed.  She was advised that she could take another Nurtec but to be absolutely sure that she did not have a vascular event she would have to go to the emergency room and get a CT scan done.  She felt a little better by the time she talk to me.  She is waiting for a sooner appointment on a wait list but has an appointment for July which was the earliest she was given.  She had previously been offered a brain MRI and admittedly had declined it in the past but is interested in pursuing a brain MRI at this time.  I advised her that I would let her providers and their nursing support staff know.  Please review: Is a sooner available appointment?  Is there any development on the prior authorization now that she has also tried and failed Nurtec for prevention? Could you order a brain MRI for her?  She was encouraged to go to the [facility] ER as it tends to be less busy and if she had flareup or ongoing migraine headache symptoms, they may be able to treat her symptomatically while she is there.  She initially indicated that she would probably drive herself but she was advised to have someone take her and pick her up  if she were to be given IV medication that are potentially sedating for symptomatic treatment of her lingering migraine.  She was asking if they have an MRI scanner.  I do not believe they have an MRI scanner and even if they did, I explained to her that most likely they would do a CT scan only for an acute indication rather than an MRI which can be ordered for her as an outpatient.  Of note, she was recently taken off her tamoxifen as she had developed a uterine polyp as I understand.  She has been on it for breast cancer prevention.  She does report that her migraines increased after she was placed on tamoxifen.  I explained to her that hopefully with time now that she is off the tamoxifen, her migraine severity and frequency will abate.   She demonstrated understanding and voiced agreement with our plan.  She was appreciative of the call.

## 2023-01-11 NOTE — ED Notes (Signed)
ED Provider at bedside. 

## 2023-01-13 ENCOUNTER — Encounter: Payer: Self-pay | Admitting: Neurology

## 2023-01-13 NOTE — Telephone Encounter (Signed)
Called and spoke to patient, she is agreeable to plan and is going to come sign letter today and accepted the in person appointment for 04.30.24. appeal letter placed at front for signature and samples placed for patient.

## 2023-01-13 NOTE — Telephone Encounter (Signed)
I talked with Florentina Addison, RN about this PA.  They have already completed an expedited appeal for Emgality.  Will require signature by myself and patient (I will be in the office tomorrow, see if Dr. Terrace Arabia can cosign today).  In summary, back in January was experiencing some constipation likely related to Aimovig.  We started the transition to Ajovy, it was denied.  Aimovig and Emgality are on her formulary.  It seems Emgality should be approved.  Not likely Bennie Pierini would be covered.  I reviewed the ER visit 01/11/23, presented with headache with numbness/tingling/burning to the right side.  She was given IV Compazine, Benadryl.  CT head was normal.  CBC was normal, potassium 3.2.   We can pursue MRI of the brain with and without contrast.  We can also offer Aimovig samples in the interim until Emgality is approved.  I did see that I have a slot open tomorrow at 245, I held it for her.  If she cannot come in we can do a virtual visit.

## 2023-01-13 NOTE — Telephone Encounter (Signed)
Appeal letter has been faxed to [facility] as urgent

## 2023-01-14 ENCOUNTER — Ambulatory Visit: Payer: BC Managed Care – PPO | Admitting: Neurology

## 2023-01-14 ENCOUNTER — Encounter: Payer: Self-pay | Admitting: Neurology

## 2023-01-14 VITALS — BP 118/72 | HR 90 | Ht 64.0 in | Wt 125.0 lb

## 2023-01-14 DIAGNOSIS — G43709 Chronic migraine without aura, not intractable, without status migrainosus: Secondary | ICD-10-CM | POA: Diagnosis not present

## 2023-01-14 MED ORDER — NORTRIPTYLINE HCL 25 MG PO CAPS: 50.0000 mg | ORAL_CAPSULE | Freq: Every day | ORAL | 5 refills | Status: DC

## 2023-01-14 NOTE — Progress Notes (Addendum)
Patient: Jaime Burch Date of Birth: 05/18/1976  Reason for Visit: Follow up History from: Patient Primary Neurologist: Dr.Yan   ASSESSMENT AND PLAN 47 y.o. year old female   1.  Chronic migraine headache  -Check MRI of the brain with and without contrast given new migraine features including tingling to the right face, arm, leg (CT head was normal in ER) -Unknown etiology for increase in headache, Aimovig 140 mg and nortriptyline 25 mg were working well up until the end of March/early April, has been on tamoxifen, just stopped a few days ago due to reportedly 5 times higher levels of estrogen?  Contributing to increase in migraines? -We have been actively working to get [employer] approved by insurance, we have sent an urgent PA -In the meantime, increase nortriptyline 50 mg at bedtime for migraine prevention, will continue to plan to start Emgality for migraine prevention -Ajovy was denied by insurance -I have given her samples of Nurtec and Ubrelvy to use for rescue -I would hold the Imitrex given new neurological symptoms until we have MRI -Keep close follow-up, update via MyChart, next appointment is 04/03/23 -Headache days: 30/30 (severe migraines 4-5 days a week)  -Previously tried and failed: nortriptyline, Aimovig, Imitrex, Maxalt  Orders Placed This Encounter  Procedures   MR BRAIN W WO CONTRAST   Meds ordered this encounter  Medications   nortriptyline (PAMELOR) 25 MG capsule    Sig: Take 2 capsules (50 mg total) by mouth at bedtime.    Dispense:  60 capsule    Refill:  5    HISTORY  [name], is a 47 year old female, seen in request by her primary care doctor [name].  for evaluation of right facial pain, initial evaluation was on May 29, 2021   I reviewed and summarized the referring note. PMHX. Kidney stone.   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   She had a history of shingles, involving right C1-C2   UPDATE Jan 5th 2023: She is not taking nortriptyline 10+25 mg every night, reported 50% improvement, much less headache, less severe, but she continue complains of side effect with nortriptyline, dry mouth, worsening constipation,  About once a week she use Imitrex for moderate to severe headaches, which does help her much better with over-the-counter medications, only few times she has to take second dose, she does have nausea with most severe headaches, sometimes preceded by blurry vision before the onset of the headache  We personally reviewed MRI of the brain with without contrast from [facility], no acute intracranial abnormality  Update April 01, 2022 SS: Aimovig was started 70 mg 09/20/21, increased to 140 mg May 2023. Things are much better. Still has some sensation of right side face feeling different, it hasn't worsened, has not had any visual change/nausea. Takes Imitrex, often 100 mg at 1 time, will help, 50 mg  wasn't enough. Laying down helps. Still on nortriptyline 25 mg, is able to tolerate. Hasn't needed Imitrex since May.   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 I reviewed the ER visit 01/11/23, presented with headache with numbness/tingling/burning to the entire right side.  She was  given IV Compazine, Benadryl.  CT head was normal.  CBC was normal, potassium 3.2. IV medication made her feel "crazy", couldn't be still, helped with nausea, knew couldn't cooperate for MRI. In October 2023, started on tamoxifen, estrogen levels high, stopped it this week due to uterine polyp. In Jan, messaged about constipation, still on nortriptyline 25 mg,  was on Aimovig 140 mg. Last took on March 15 th. We tried to switch to [facility] for less constipation. Was taking Nurtec 75 every other day to bridge until insurance approved Emgality. Insurance denied Ajovy.  REVIEW OF SYSTEMS: Out of a complete 14 system review of symptoms, the patient complains only of the following symptoms, and all other reviewed systems are negative.  See HPI  ALLERGIES: Allergies  Allergen Reactions   Morphine Other (See Comments), Shortness Of Breath and Palpitations    Other reaction(s): Hypertension (intolerance) Tachycardia, SOB Tachycardia, SOB     HOME MEDICATIONS: Outpatient Medications Prior to Visit  Medication Sig Dispense Refill   chlorthalidone (HYGROTON) 25 MG tablet Take 25 mg by mouth daily.     Cholecalciferol (VITAMIN D) 50 MCG (2000 UT) CAPS Take 2,000 Units by mouth daily.     Galcanezumab-gnlm (EMGALITY) 120 MG/ML SOAJ Inject 1 Pen into the skin every 28 (twenty-eight) days. 1 mL 11   Multiple Vitamins-Minerals (HAIR SKIN AND NAILS FORMULA PO)      Omega-3 Fatty Acids (FISH OIL PO) Take by mouth.     ondansetron (ZOFRAN-ODT) 4 MG disintegrating tablet Take 1 tablet (4 mg total) by mouth every 8 (eight) hours as needed for nausea or vomiting. 20 tablet 6   pantoprazole (PROTONIX) 40 MG tablet Take 40 mg by mouth daily.     Potassium Bicarbonate (KLOR-CON/EF PO) Take 1 tablet by mouth daily. 60 mg     Probiotic Product (PROBIOTIC ADVANCED PO) Take by mouth.     SUMAtriptan (IMITREX) 100 MG tablet Take 1 tablet (100 mg total) by mouth every 2 (two) hours as needed for migraine. May repeat in  2 hours if headache persists or recurs. 10 tablet 11   nortriptyline (PAMELOR) 25 MG capsule Take 1 capsule (25 mg total) by mouth at bedtime. 30 capsule 3   ondansetron (ZOFRAN ODT) 4 MG disintegrating tablet Take 1 tablet (4 mg total) by mouth every 8 (eight) hours as needed for nausea or vomiting. 20 tablet 6   No facility-administered medications prior to visit.    PAST MEDICAL HISTORY: Past Medical History:  Diagnosis Date   Arthritis    Chronic back pain    Headache    migraine   Kidney stones    Tinnitus     PAST SURGICAL HISTORY: Past Surgical History:  Procedure Laterality Date   BREAST SURGERY     CESAREAN SECTION     CHOLECYSTECTOMY     DILATION AND CURETTAGE OF UTERUS  12/2022   hysteroscopy with polypectomy  12/2022    FAMILY HISTORY: Family History  Problem Relation Age of Onset   Breast cancer Maternal Aunt    Lupus Maternal Grandmother    Heart disease Maternal Grandfather  SOCIAL HISTORY: Social History   Socioeconomic History   Marital status: Divorced    Spouse name: Not on file   Number of children: 1   Years of education: Not on file   Highest education level: Not on file  Occupational History   Not on file  Tobacco Use   Smoking status: Never   Smokeless tobacco: Never  Substance and Sexual Activity   Alcohol use: Never   Drug use: Never   Sexual activity: Not on file  Other Topics Concern   Not on file  Social History Narrative   Not on file   Social Determinants of Health   Financial Resource Strain: Not on file  Food Insecurity: Not on file  Transportation Needs: Not on file  Physical Activity: Not on file  Stress: Not on file  Social Connections: Not on file  Intimate Partner Violence: Not on file   PHYSICAL EXAM  Vitals:   01/14/23 1440  BP: 118/72  Pulse: 90  Weight: 125 lb (56.7 kg)  Height: 5\' 4"  (1.626 m)    Body mass index is 21.46 kg/m.  Generalized: Well developed, in no acute distress   Neurological examination  Mentation: Alert oriented to time, place, history taking. Follows all commands speech and language fluent Cranial nerve II-XII: Pupils were equal round reactive to light. Extraocular movements were full, visual field were full on confrontational test. Facial sensation and strength were normal. Head turning and shoulder shrug  were normal and symmetric. Motor: The motor testing reveals 5 over 5 strength of all 4 extremities. Good symmetric motor tone is noted throughout.  Sensory: Sensory testing is intact to soft touch on all 4 extremities. No evidence of extinction is noted.  Coordination: Cerebellar testing reveals good finger-nose-finger and heel-to-shin bilaterally.  Gait and station: Gait is normal.  Reflexes: Deep tendon reflexes are symmetric and normal bilaterally.   DIAGNOSTIC DATA (LABS, IMAGING, TESTING) - I reviewed patient records, labs, notes, testing and imaging myself where available.  Lab Results  Component Value Date   WBC 4.4 01/11/2023   HGB 13.3 01/11/2023   HCT 38.5 01/11/2023   MCV 89.7 01/11/2023   PLT 206 01/11/2023      Component Value Date/Time   NA 140 01/11/2023 1337   K 3.2 (L) 01/11/2023 1337   CL 103 01/11/2023 1337   CO2 28 01/11/2023 1337   GLUCOSE 100 (H) 01/11/2023 1337   BUN 8 01/11/2023 1337   CREATININE 0.54 01/11/2023 1337   CALCIUM 9.3 01/11/2023 1337   PROT 6.3 (L) 01/11/2023 1337   ALBUMIN 4.1 01/11/2023 1337   AST 15 01/11/2023 1337   ALT 10 01/11/2023 1337   ALKPHOS 32 (L) 01/11/2023 1337   BILITOT 0.4 01/11/2023 1337   GFRNONAA >60 01/11/2023 1337   No results found for: "CHOL", "HDL", "LDLCALC", "LDLDIRECT", "TRIG", "CHOLHDL" No results found for: "HGBA1C" No results found for: "VITAMINB12" No results found for: "TSH"  Margie Ege, AGNP-C, DNP 01/14/2023, 4:05 PM [facility] [address] [place], [place] [address] [phone]

## 2023-01-14 NOTE — Patient Instructions (Addendum)
Increase the nortriptyline to 50 mg at bedtime Check MRI of the brain with and without contrast  Continue the Nurtec as needed for migraine, can try the Ubrelvy for acute rescue, only try 1 per day  We will continue with the Emgality trying to get authorization   Ubrelvy take 1 tablet at onset, may repeat in 2 hours later if needed, max 200 mg in 24 hours  Nurtec is 1 tablet every 24 hours as needed

## 2023-01-16 ENCOUNTER — Encounter: Payer: Self-pay | Admitting: Neurology

## 2023-01-20 ENCOUNTER — Encounter: Payer: Self-pay | Admitting: Neurology

## 2023-01-20 ENCOUNTER — Telehealth: Payer: Self-pay | Admitting: Neurology

## 2023-01-20 MED ORDER — EMGALITY 120 MG/ML ~~LOC~~ SOAJ: 240.0000 mg | Freq: Once | SUBCUTANEOUS | 0 refills | Status: AC

## 2023-01-20 NOTE — Telephone Encounter (Signed)
See other phone note

## 2023-01-20 NOTE — Telephone Encounter (Signed)
Pt scheduled for 45 mins MR brain w/wo contrast at [facility] for 01/21/23 at 12:30pm  [employer] auth# [id number] (01/14/23-02/12/23)

## 2023-01-21 ENCOUNTER — Ambulatory Visit (INDEPENDENT_AMBULATORY_CARE_PROVIDER_SITE_OTHER): Payer: BC Managed Care – PPO

## 2023-01-21 DIAGNOSIS — G43709 Chronic migraine without aura, not intractable, without status migrainosus: Secondary | ICD-10-CM | POA: Diagnosis not present

## 2023-01-21 MED ORDER — GADOBENATE DIMEGLUMINE 529 MG/ML IV SOLN
10.0000 mL | Freq: Once | INTRAVENOUS | Status: AC | PRN
Start: 2023-01-21 — End: 2023-01-21
  Administered 2023-01-21: 10 mL via INTRAVENOUS

## 2023-01-26 ENCOUNTER — Encounter: Payer: Self-pay | Admitting: Neurology

## 2023-01-28 ENCOUNTER — Encounter: Payer: Self-pay | Admitting: Neurology

## 2023-01-29 ENCOUNTER — Telehealth: Payer: Self-pay | Admitting: Neurology

## 2023-01-29 ENCOUNTER — Encounter: Payer: Self-pay | Admitting: Neurology

## 2023-01-29 MED ORDER — NURTEC 75 MG PO TBDP: 75.0000 mg | ORAL_TABLET | ORAL | 11 refills | Status: DC | PRN

## 2023-01-29 NOTE — Telephone Encounter (Signed)
I called the patient. She was able to get the 2 loading doses of Emgality from the pharmacy with a coupon card on Saturday. She has noticed some improvement but still daily headache. Will check with pharmacy to see if she can keep using the co-pay.  I read the denial letter, and I am not sure why it was denied. Can we fax the last office note with the specific headache days highlighted to make sure they have the accurate information. She is going to check with pharmacy with co-pay card. We will stay in touch. Yesterday I called the appeals # and left messages with my cell phone # asking for a call back so I can do peer to peer.

## 2023-01-29 NOTE — Telephone Encounter (Signed)
I sent in Nurtec for rescue, this works well for her.   Meds ordered this encounter  Medications   Rimegepant Sulfate (NURTEC) 75 MG TBDP    Sig: Take 1 tablet (75 mg total) by mouth as needed (take 1 tablet at onset of headache, max is 1 tablet in 24 hours).    Dispense:  8 tablet    Refill:  11

## 2023-01-29 NOTE — Telephone Encounter (Signed)
Samples at the front for pt pick  up

## 2023-01-29 NOTE — Telephone Encounter (Addendum)
Faxed urgent appeal with 30/30 days of headaches to [employer] [phone]

## 2023-01-31 DIAGNOSIS — D229 Melanocytic nevi, unspecified: Secondary | ICD-10-CM | POA: Insufficient documentation

## 2023-01-31 DIAGNOSIS — D1801 Hemangioma of skin and subcutaneous tissue: Secondary | ICD-10-CM | POA: Insufficient documentation

## 2023-02-04 ENCOUNTER — Encounter: Payer: Self-pay | Admitting: Neurology

## 2023-02-05 ENCOUNTER — Encounter: Payer: Self-pay | Admitting: Neurology

## 2023-02-06 ENCOUNTER — Other Ambulatory Visit (HOSPITAL_COMMUNITY): Payer: Self-pay

## 2023-02-24 ENCOUNTER — Encounter: Payer: Self-pay | Admitting: Neurology

## 2023-03-17 ENCOUNTER — Other Ambulatory Visit: Payer: Self-pay | Admitting: Neurology

## 2023-03-17 NOTE — Telephone Encounter (Signed)
PA needed for Nurtec. 

## 2023-04-02 ENCOUNTER — Other Ambulatory Visit (HOSPITAL_COMMUNITY): Payer: Self-pay

## 2023-04-02 ENCOUNTER — Telehealth: Payer: Self-pay

## 2023-04-02 NOTE — Telephone Encounter (Signed)
Pharmacy Patient Advocate Encounter   Received notification from Physician's Office that prior authorization for Nurtec 75MG  dispersible tablets is required/requested.   Insurance verification completed.   The patient is insured through [facility] .   Per test claim: PA started via CoverMyMeds. KEY [id number] . Waiting for clinical questions to populate.

## 2023-04-02 NOTE — Telephone Encounter (Signed)
Clinical questions have been submitted-awaiting determination. 

## 2023-04-03 ENCOUNTER — Ambulatory Visit: Payer: BC Managed Care – PPO | Admitting: Neurology

## 2023-04-06 NOTE — Telephone Encounter (Signed)
Pharmacy Patient Advocate Encounter  Received notification from [facility] that Prior Authorization for Nurtec 75MG  dispersible tablets has been APPROVED from 04-02-2023 to 07-05-2023.Marland Kitchen  PA #/Case ID/Reference #: [id number]

## 2023-04-29 ENCOUNTER — Other Ambulatory Visit (HOSPITAL_COMMUNITY): Payer: Self-pay

## 2023-04-29 ENCOUNTER — Encounter: Payer: Self-pay | Admitting: Neurology

## 2023-04-29 ENCOUNTER — Ambulatory Visit: Payer: BC Managed Care – PPO | Admitting: Neurology

## 2023-04-29 ENCOUNTER — Telehealth: Payer: Self-pay

## 2023-04-29 VITALS — BP 137/80 | HR 78 | Ht 64.0 in | Wt 127.4 lb

## 2023-04-29 DIAGNOSIS — G43709 Chronic migraine without aura, not intractable, without status migrainosus: Secondary | ICD-10-CM

## 2023-04-29 MED ORDER — EMGALITY 120 MG/ML ~~LOC~~ SOAJ: 1.0000 | SUBCUTANEOUS | 11 refills | Status: DC

## 2023-04-29 MED ORDER — NORTRIPTYLINE HCL 25 MG PO CAPS: 50.0000 mg | ORAL_CAPSULE | Freq: Every day | ORAL | 11 refills | Status: DC

## 2023-04-29 MED ORDER — NURTEC 75 MG PO TBDP: 75.0000 mg | ORAL_TABLET | ORAL | 11 refills | Status: DC | PRN

## 2023-04-29 NOTE — Telephone Encounter (Signed)
New pa needed for emgality

## 2023-04-29 NOTE — Telephone Encounter (Signed)
PA request has been Submitted. New Encounter created for follow up. For additional info see Pharmacy Prior Auth telephone encounter from 04/29/2023.

## 2023-04-29 NOTE — Patient Instructions (Signed)
Great to see you today.  We will continue current medications.  Please let me know if you need anything.  Thanks!!

## 2023-04-29 NOTE — Telephone Encounter (Signed)
-----   Message from Glean Salvo sent at 04/29/2023  1:36 PM EDT ----- I put insurance paper in the box from patient. I think Emgality will need re-auth? Can you guys check? Lengthy process to get from the start. Thanks!!

## 2023-04-29 NOTE — Telephone Encounter (Signed)
Pharmacy Patient Advocate Encounter   Received notification from Physician's Office that prior authorization for Emgality 120MG /ML auto-injectors (migraine) is required/requested.   Insurance verification completed.   The patient is insured through [facility] .   Per test claim: PA required; PA started via CoverMyMeds. KEY  [id number] . Waiting for clinical questions to populate.

## 2023-04-29 NOTE — Progress Notes (Signed)
Patient: Jaime Burch Date of Birth: 01/05/1976  Reason for Visit: Follow up History from: Patient Primary Neurologist: Dr.Yan   ASSESSMENT AND PLAN 47 y.o. year old female   1.  Chronic migraine headache  -Significant improvement with Emgality, continue monthly injection for migraine prevention.  Continue nortriptyline 25 mg at bedtime for migraine prevention. -Continue Nurtec 75 mg as needed for acute headache -Previously tried and failed: nortriptyline, Aimovig, Imitrex, Maxalt, insurance denied Ajovy -Headache days: 3/30 (was 30/30 before Emgality!!) -Follow-up in 1 year or sooner if needed  No orders of the defined types were placed in this encounter.  Meds ordered this encounter  Medications   nortriptyline (PAMELOR) 25 MG capsule    Sig: Take 2 capsules (50 mg total) by mouth at bedtime.    Dispense:  60 capsule    Refill:  11   Rimegepant Sulfate (NURTEC) 75 MG TBDP    Sig: Take 1 tablet (75 mg total) by mouth as needed (take 1 tablet at onset, max is 1 tablet in 24 hours).    Dispense:  8 tablet    Refill:  11   Galcanezumab-gnlm (EMGALITY) 120 MG/ML SOAJ    Sig: Inject 1 Pen into the skin every 28 (twenty-eight) days.    Dispense:  1 mL    Refill:  11    HISTORY  Jaime Burch, is a 47 year old female, seen in request by her primary care doctor Jaime Burch.  for evaluation of right facial pain, initial evaluation was on May 29, 2021   I reviewed and summarized the referring note. PMHX. Kidney stone.   She denies a previous history of migraine headaches, on February 24, 2021, she woke up noticed sore throat, ear pain, and right side pressure, 2 weeks later, she felt more dull achy pain at the right face, felt heavy on the right side, also felt right arm, leg numbness, she drove herself to urgent care, had MRI of the brain with without contrast May 09, 2021, no significant abnormality, few small vessel disease, vascular loop abuts the mid cisternal  segment of the right trigeminal nerve, and right cochlear nerve  Since then, she had intermittent right facial abnormal sensation, but denies radiating pain, she also describes right-sided headache with nausea, light noise sensitivity.   She had a history of shingles, involving right C1-C2   UPDATE Jan 5th 2023: She is not taking nortriptyline 10+25 mg every night, reported 50% improvement, much less headache, less severe, but she continue complains of side effect with nortriptyline, dry mouth, worsening constipation,  About once a week she use Imitrex for moderate to severe headaches, which does help her much better with over-the-counter medications, only few times she has to take second dose, she does have nausea with most severe headaches, sometimes preceded by blurry vision before the onset of the headache  We personally reviewed MRI of the brain with without contrast from [facility], no acute intracranial abnormality  Update April 01, 2022 SS: Aimovig was started 70 mg 09/20/21, increased to 140 mg May 2023. Things are much better. Still has some sensation of right side face feeling different, it hasn't worsened, has not had any visual change/nausea. Takes Imitrex, often 100 mg at 1 time, will help, 50 mg wasn't enough. Laying down helps. Still on nortriptyline 25 mg, is able to tolerate. Hasn't needed Imitrex since May.   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 I reviewed the ER visit 01/11/23, presented with headache with numbness/tingling/burning to the entire right side.  She was given IV Compazine, Benadryl.  CT head was normal.  CBC was normal, potassium 3.2. IV medication made her feel "crazy", couldn't be still, helped with nausea, knew couldn't cooperate for MRI. In October 2023, started on tamoxifen, estrogen levels high, stopped it this week due to uterine polyp. In Jan, messaged about  constipation, still on nortriptyline 25 mg,  was on Aimovig 140 mg. Last took on March 15 th. We tried to switch to [facility] for less constipation. Was taking Nurtec 75 every other day to bridge until insurance approved Emgality. Insurance denied Ajovy.  Update April 29, 2023 SS: MRI of the brain with and without contrast May 2024 was normal. On Emgality, insurance is covering for now, 3 migraines a month. Much better. Still on nortriptyline 50 mg at bedtime. Nurtec works well for her. She is very pleased. Is off tamoxifen.   REVIEW OF SYSTEMS: Out of a complete 14 system review of symptoms, the patient complains only of the following symptoms, and all other reviewed systems are negative.  See HPI  ALLERGIES: Allergies  Allergen Reactions   Morphine Other (See Comments), Shortness Of Breath and Palpitations    Other reaction(s): Hypertension (intolerance) Tachycardia, SOB Tachycardia, SOB     HOME MEDICATIONS: Outpatient Medications Prior to Visit  Medication Sig Dispense Refill   chlorthalidone (HYGROTON) 25 MG tablet Take 25 mg by mouth daily.     Cholecalciferol (VITAMIN D) 50 MCG (2000 UT) CAPS Take 2,000 Units by mouth daily.     Omega-3 Fatty Acids (FISH OIL PO) Take by mouth.     ondansetron (ZOFRAN-ODT) 4 MG disintegrating tablet Take 1 tablet (4 mg total) by mouth every 8 (eight) hours as needed for nausea or vomiting. 20 tablet 6   pantoprazole (PROTONIX) 40 MG tablet Take 40 mg by mouth daily.     Potassium Bicarbonate (KLOR-CON/EF PO) Take 1 tablet by mouth daily. 60 mg     Probiotic Product (PROBIOTIC ADVANCED PO) Take by mouth.     SUMAtriptan (IMITREX) 100 MG tablet Take 1 tablet (100 mg total) by mouth every 2 (two) hours as needed for migraine. May repeat in 2 hours if headache persists or recurs. 10 tablet 11   Galcanezumab-gnlm (EMGALITY) 120 MG/ML SOAJ Inject 1 Pen into the skin every 28 (twenty-eight) days. 1 mL 11   nortriptyline (PAMELOR) 25 MG capsule Take 2  capsules (50 mg total) by mouth at bedtime. 60 capsule 5   NURTEC 75 MG TBDP TAKE 1 TABLET BY MOUTH AS NEEDED FOR HEADACHE ONSET. MAX DOSE: 1 TABLET 8 tablet 11   Multiple Vitamins-Minerals (HAIR SKIN AND NAILS FORMULA PO)      No facility-administered medications prior to visit.    PAST MEDICAL HISTORY: Past Medical History:  Diagnosis Date   Arthritis    Chronic back pain    Headache    migraine   Kidney stones    Tinnitus     PAST SURGICAL HISTORY: Past Surgical History:  Procedure Laterality Date   BREAST SURGERY     CESAREAN SECTION     CHOLECYSTECTOMY     DILATION AND CURETTAGE OF UTERUS  12/2022   hysteroscopy with polypectomy  12/2022    FAMILY HISTORY: Family History  Problem Relation Age of Onset   Breast cancer Maternal Aunt    Lupus Maternal Grandmother  Heart disease Maternal Grandfather     SOCIAL HISTORY: Social History   Socioeconomic History   Marital status: Divorced    Spouse name: Not on file   Number of children: 1   Years of education: Not on file   Highest education level: Master's degree (e.g., MA, MS, MEng, MEd, MSW, MBA)  Occupational History   Not on file  Tobacco Use   Smoking status: Never   Smokeless tobacco: Never  Vaping Use   Vaping status: Never Used  Substance and Sexual Activity   Alcohol use: Never   Drug use: Never   Sexual activity: Not Currently  Other Topics Concern   Not on file  Social History Narrative   Not on file   Social Determinants of Health   Financial Resource Strain: Not on file  Food Insecurity: No Food Insecurity (10/28/2022)   Received from [facility], [facility]   Hunger Vital Sign    Worried About Running Out of Food in the Last Year: Never true    Ran Out of Food in the Last Year: Never true  Transportation Needs: No Transportation Needs (10/28/2022)   Received from [facility], [facility]   PRAPARE -  Transportation    In the past 12 months, has lack of transportation kept you from medical appointments or from getting medications?: No    Lack of Transportation (Non-Medical): No  Physical Activity: Not on file  Stress: Not on file  Social Connections: Not on file  Intimate Partner Violence: Not on file   PHYSICAL EXAM  Vitals:   04/29/23 1251  BP: 137/80  Pulse: 78  Weight: 127 lb 6.4 oz (57.8 kg)  Height: 5\' 4"  (1.626 m)   Body mass index is 21.87 kg/m.  Generalized: Well developed, in no acute distress  Neurological examination  Mentation: Alert oriented to time, place, history taking. Follows all commands speech and language fluent Cranial nerve II-XII: Pupils were equal round reactive to light. Extraocular movements were full, visual field were full on confrontational test. Facial sensation and strength were normal. Head turning and shoulder shrug  were normal and symmetric. Motor: The motor testing reveals 5 over 5 strength of all 4 extremities. Good symmetric motor tone is noted throughout.  Sensory: Sensory testing is intact to soft touch on all 4 extremities. No evidence of extinction is noted.  Coordination: Cerebellar testing reveals good finger-nose-finger and heel-to-shin bilaterally.  Gait and station: Gait is normal.  Reflexes: Deep tendon reflexes are symmetric and normal bilaterally.   DIAGNOSTIC DATA (LABS, IMAGING, TESTING) - I reviewed patient records, labs, notes, testing and imaging myself where available.  Lab Results  Component Value Date   WBC 4.4 01/11/2023   HGB 13.3 01/11/2023   HCT 38.5 01/11/2023   MCV 89.7 01/11/2023   PLT 206 01/11/2023      Component Value Date/Time   NA 140 01/11/2023 1337   K 3.2 (L) 01/11/2023 1337   CL 103 01/11/2023 1337   CO2 28 01/11/2023 1337   GLUCOSE 100 (H) 01/11/2023 1337   BUN 8 01/11/2023 1337   CREATININE 0.54 01/11/2023 1337   CALCIUM 9.3 01/11/2023 1337   PROT 6.3 (L) 01/11/2023 1337   ALBUMIN 4.1  01/11/2023 1337   AST 15 01/11/2023 1337   ALT 10 01/11/2023 1337   ALKPHOS 32 (L) 01/11/2023 1337   BILITOT 0.4 01/11/2023 1337   GFRNONAA >60 01/11/2023 1337   No results found for: "  CHOL", "HDL", "LDLCALC", "LDLDIRECT", "TRIG", "CHOLHDL" No results found for: "HGBA1C" No results found for: "VITAMINB12" No results found for: "TSH"  Margie Ege, AGNP-C, DNP 04/29/2023, 1:34 PM [facility] [address] [place], [place] [address] [phone]

## 2023-05-01 ENCOUNTER — Other Ambulatory Visit (HOSPITAL_COMMUNITY): Payer: Self-pay

## 2023-05-01 NOTE — Telephone Encounter (Signed)
Pharmacy Patient Advocate Encounter  Received notification from [facility] that Prior Authorization for Emgality 120MG /ML auto-injectors (migraine) has been APPROVED from 04-30-2023 to 04-29-2024   PA #/Case ID/Reference #: Jaime Burch

## 2023-06-08 ENCOUNTER — Other Ambulatory Visit: Payer: Self-pay | Admitting: Neurology

## 2023-07-02 ENCOUNTER — Telehealth: Payer: Self-pay

## 2023-07-02 NOTE — Telephone Encounter (Signed)
*  [facility]  Pharmacy Patient Advocate Encounter   Received notification from CoverMyMeds that prior authorization for Nurtec 75MG  dispersible tablets  is required/requested.   Insurance verification completed.   The patient is insured through [facility] .   Per test claim: PA required; PA submitted to [facility] via CoverMyMeds Key/confirmation #/EOC [id number] Status is pending

## 2023-07-04 NOTE — Telephone Encounter (Signed)
Received a fax form requesting additional information-faxed completed form along with clinicals to [phone].

## 2023-07-09 NOTE — Telephone Encounter (Signed)
Pharmacy Patient Advocate Encounter  Received notification from [facility] that Prior Authorization for  Nurtec 75MG  dispersible tablets has been APPROVED from 07-02-2023 to 07-01-2024   PA #/Case ID/Reference #: [id number]

## 2023-08-08 ENCOUNTER — Other Ambulatory Visit: Payer: Self-pay | Admitting: Neurology

## 2023-09-08 DIAGNOSIS — R7989 Other specified abnormal findings of blood chemistry: Secondary | ICD-10-CM | POA: Insufficient documentation

## 2023-09-29 ENCOUNTER — Encounter: Payer: Self-pay | Admitting: Neurology

## 2023-10-08 ENCOUNTER — Encounter: Payer: Self-pay | Admitting: Neurology

## 2023-12-25 ENCOUNTER — Ambulatory Visit: Admitting: Neurology

## 2023-12-25 ENCOUNTER — Telehealth: Payer: Self-pay | Admitting: Neurology

## 2023-12-25 ENCOUNTER — Encounter: Payer: Self-pay | Admitting: Neurology

## 2023-12-25 VITALS — BP 118/78 | Ht 64.0 in | Wt 127.0 lb

## 2023-12-25 DIAGNOSIS — G43709 Chronic migraine without aura, not intractable, without status migrainosus: Secondary | ICD-10-CM | POA: Diagnosis not present

## 2023-12-25 NOTE — Telephone Encounter (Signed)
 Completed [employer] PA form and placed in nurse pod for NP signature.

## 2023-12-25 NOTE — Telephone Encounter (Signed)
 Can we start process for Botox for chronic migraine headache? Paperwork completed. See office note for specifics. Thanks

## 2023-12-25 NOTE — Progress Notes (Signed)
 Patient: Jaime Burch Date of Birth: 11-Aug-1976  Reason for Visit: Follow up History from: Patient Primary Neurologist: Dr.Yan   ASSESSMENT AND PLAN 48 y.o. year old female   1.  Chronic migraine headache  -Daily headache, 2-3 migraines weekly that can last a few days  - Start Botox for migraine prevention - Continue Emgality for migraine prevention for now, along with nortriptyline 50 mg at bedtime - Use Nurtec 75 mg as needed for acute headache, I gave her 2 boxes of Ubrelvy to try  -Previously tried and failed: nortriptyline, Aimovig, Imitrex, Maxalt, insurance denied Ajovy - Follow-up for Botox  HISTORY  Jaime Burch, is a 48 year old female, seen in request by her primary care doctor [name].  for evaluation of right facial pain, initial evaluation was on May 29, 2021   I reviewed and summarized the referring note. PMHX. Kidney stone.   She denies a previous history of migraine headaches, on February 24, 2021, she woke up noticed sore throat, ear pain, and right side pressure, 2 weeks later, she felt more dull achy pain at the right face, felt heavy on the right side, also felt right arm, leg numbness, she drove herself to urgent care, had MRI of the brain with without contrast May 09, 2021, no significant abnormality, few small vessel disease, vascular loop abuts the mid cisternal segment of the right trigeminal nerve, and right cochlear nerve  Since then, she had intermittent right facial abnormal sensation, but denies radiating pain, she also describes right-sided headache with nausea, light noise sensitivity.   She had a history of shingles, involving right C1-C2   UPDATE Jan 5th 2023: She is not taking nortriptyline 10+25 mg every night, reported 50% improvement, much less headache, less severe, but she continue complains of side effect with nortriptyline, dry mouth, worsening constipation,  About once a week she use Imitrex for moderate to severe  headaches, which does help her much better with over-the-counter medications, only few times she has to take second dose, she does have nausea with most severe headaches, sometimes preceded by blurry vision before the onset of the headache  We personally reviewed MRI of the brain with without contrast from [facility], no acute intracranial abnormality  Update April 01, 2022 SS: Aimovig was started 70 mg 09/20/21, increased to 140 mg May 2023. Things are much better. Still has some sensation of right side face feeling different, it hasn't worsened, has not had any visual change/nausea. Takes Imitrex, often 100 mg at 1 time, will help, 50 mg wasn't enough. Laying down helps. Still on nortriptyline 25 mg, is able to tolerate. Hasn't needed Imitrex since May.   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 I reviewed the ER visit 01/11/23, presented with headache with numbness/tingling/burning to the entire right side.  She was given IV Compazine, Benadryl.  CT head was normal.  CBC was normal, potassium 3.2. IV medication made her feel "crazy", couldn't be still, helped with nausea, knew couldn't cooperate for MRI. In October 2023, started on tamoxifen, estrogen levels high, stopped it this week due to uterine polyp. In Jan, messaged about constipation, still on nortriptyline 25 mg,  was on Aimovig 140 mg. Last took on March 15 th. We tried to switch to [facility] for less constipation. Was taking Nurtec 75 every other day to bridge until insurance approved Emgality. Insurance denied Ajovy.  Update April 29, 2023 SS: MRI of the brain with and without contrast May 2024 was normal. On Emgality, insurance is covering for now, 3 migraines a month. Much better. Still on nortriptyline 50 mg at bedtime. Nurtec works well for her. She is very pleased. Is off tamoxifen.   Update December 25, 2023 SS: Remains on [place], Nortriptyline 50  mg daily, uses Nurtec PRN. Hormone factor contributing to migraines, periods are unreliable. Having daily headache, 2-3 migraines a week. Uses all Nurtec # 8 in the past. Tried sample of Ubrelvy in the past with good benefit. Had a fall in January, was having some numbness to right arm, could be left, it resolved. 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    REVIEW OF SYSTEMS: Out of a complete 14 system review of symptoms, the patient complains only of the following symptoms, and all other reviewed systems are negative.  See HPI  ALLERGIES: Allergies  Allergen Reactions   Morphine Other (See Comments), Shortness Of Breath and Palpitations    Other reaction(s): Hypertension (intolerance) Tachycardia, SOB Tachycardia, SOB     HOME MEDICATIONS: Outpatient Medications Prior to Visit  Medication Sig Dispense Refill   chlorthalidone (HYGROTON) 25 MG tablet Take 25 mg by mouth daily.     Cholecalciferol (VITAMIN D) 50 MCG (2000 UT) CAPS Take 2,000 Units by mouth daily.     Galcanezumab-gnlm (EMGALITY) 120 MG/ML SOAJ INJECT 240 MG INTO THE SKIN ONCE FOR 1 DOSE. 1 mL 11   nortriptyline (PAMELOR) 50 MG capsule Take 50 mg by mouth at bedtime.     Omega-3 Fatty Acids (FISH OIL PO) Take by mouth.     ondansetron (ZOFRAN-ODT) 4 MG disintegrating tablet Take 1 tablet (4 mg total) by mouth every 8 (eight) hours as needed for nausea or vomiting. 20 tablet 6   pantoprazole (PROTONIX) 40 MG tablet Take 40 mg by mouth daily.     Potassium Bicarbonate (KLOR-CON/EF PO) Take 1 tablet by mouth daily. 60 mg     Probiotic Product (PROBIOTIC ADVANCED PO) Take by mouth.     Rimegepant Sulfate (NURTEC) 75 MG TBDP Take 1 tablet (75 mg total) by mouth as needed (take 1 tablet at onset, max is 1 tablet in 24 hours). 8 tablet 11   SUMAtriptan (IMITREX) 100 MG tablet Take 1 tablet (100 mg total) by mouth every 2 (two) hours as needed for migraine. May repeat in 2 hours if headache persists or recurs. 10 tablet 11   nortriptyline (PAMELOR) 25 MG  capsule TAKE 1 CAPSULE BY MOUTH AT BEDTIME. 90 capsule 3   No facility-administered medications prior to visit.    PAST MEDICAL HISTORY: Past Medical History:  Diagnosis Date   Arthritis    Chronic back pain    Headache    migraine   Kidney stones    Tinnitus     PAST SURGICAL HISTORY: Past Surgical History:  Procedure Laterality Date   BREAST SURGERY     CESAREAN SECTION     CHOLECYSTECTOMY     DILATION AND CURETTAGE OF UTERUS  12/2022   hysteroscopy with polypectomy  12/2022    FAMILY HISTORY: Family History  Problem Relation Age of Onset   Breast cancer Maternal Aunt    Lupus Maternal Grandmother    Heart disease Maternal Grandfather     SOCIAL HISTORY: Social History   Socioeconomic History   Marital status: Divorced    Spouse name: Not on file   Number of children: 1   Years of education: Not on  file   Highest education level: Master's degree (e.g., MA, MS, MEng, MEd, MSW, MBA)  Occupational History   Not on file  Tobacco Use   Smoking status: Never   Smokeless tobacco: Never  Vaping Use   Vaping status: Never Used  Substance and Sexual Activity   Alcohol use: Never   Drug use: Never   Sexual activity: Not Currently  Other Topics Concern   Not on file  Social History Narrative   Not on file   Social Drivers of Health   Financial Resource Strain: Not on file  Food Insecurity: Low Risk  (09/28/2023)   Received from [facility]   Hunger Vital Sign    Worried About Running Out of Food in the Last Year: Never true    Ran Out of Food in the Last Year: Never true  Transportation Needs: No Transportation Needs (09/28/2023)   Received from [facility]    In the past 12 months, has lack of reliable transportation kept you from medical appointments, meetings, work or from getting things needed for daily living? : No  Physical Activity: Not on file  Stress: Not on file  Social Connections: Not on file  Intimate Partner Violence:  Not on file   PHYSICAL EXAM  Vitals:   12/25/23 0833  BP: 118/78  Weight: 127 lb (57.6 kg)  Height: 5\' 4"  (1.626 m)    Body mass index is 21.8 kg/m.  Generalized: Well developed, in no acute distress  Neurological examination  Mentation: Alert oriented to time, place, history taking. Follows all commands speech and language fluent Cranial nerve II-XII: Pupils were equal round reactive to light. Extraocular movements were full, visual field were full on confrontational test. Facial sensation and strength were normal. Head turning and shoulder shrug  were normal and symmetric. Motor: The motor testing reveals 5 over 5 strength of all 4 extremities. Good symmetric motor tone is noted throughout.  Sensory: Sensory testing is intact to soft touch on all 4 extremities. No evidence of extinction is noted.  Coordination: Cerebellar testing reveals good finger-nose-finger and heel-to-shin bilaterally.  Gait and station: Gait is normal.  Reflexes: Deep tendon reflexes are symmetric and normal bilaterally.   DIAGNOSTIC DATA (LABS, IMAGING, TESTING) - I reviewed patient records, labs, notes, testing and imaging myself where available.  Lab Results  Component Value Date   WBC 4.4 01/11/2023   HGB 13.3 01/11/2023   HCT 38.5 01/11/2023   MCV 89.7 01/11/2023   PLT 206 01/11/2023      Component Value Date/Time   NA 140 01/11/2023 1337   K 3.2 (L) 01/11/2023 1337   CL 103 01/11/2023 1337   CO2 28 01/11/2023 1337   GLUCOSE 100 (H) 01/11/2023 1337   BUN 8 01/11/2023 1337   CREATININE 0.54 01/11/2023 1337   CALCIUM 9.3 01/11/2023 1337   PROT 6.3 (L) 01/11/2023 1337   ALBUMIN 4.1 01/11/2023 1337   AST 15 01/11/2023 1337   ALT 10 01/11/2023 1337   ALKPHOS 32 (L) 01/11/2023 1337   BILITOT 0.4 01/11/2023 1337   GFRNONAA >60 01/11/2023 1337   No results found for: "CHOL", "HDL", "LDLCALC", "LDLDIRECT", "TRIG", "CHOLHDL" No results found for: "HGBA1C" No results found for:  "VITAMINB12" No results found for: "TSH"  Margie Ege, AGNP-C, DNP 12/25/2023, 9:00 AM [facility] [address], [address] [place], [place] [address] [phone]

## 2023-12-25 NOTE — Telephone Encounter (Signed)
 Botox Start Form

## 2024-01-06 NOTE — Telephone Encounter (Signed)
 Faxed signed PA form with notes to [facility] @ [phone].

## 2024-01-07 MED ORDER — ONABOTULINUMTOXINA 200 UNITS IJ SOLR: 200.0000 [IU] | INTRAMUSCULAR | 2 refills | Status: DC

## 2024-01-07 NOTE — Telephone Encounter (Signed)
 Received approval from [facility]: Jaime Burch, please send rx to [facility] in [place]. Thank you!  Auth#: [id number] (01/06/24-06/22/24)

## 2024-01-07 NOTE — Addendum Note (Signed)
 Addended by: Randi Buster on: 01/07/2024 02:52 PM   Modules accepted: Orders

## 2024-01-07 NOTE — Telephone Encounter (Signed)
 Sent as requested by botox coordinator jilian

## 2024-01-13 ENCOUNTER — Telehealth: Payer: Self-pay | Admitting: Neurology

## 2024-01-13 NOTE — Telephone Encounter (Signed)
 Mattie @ [facility] has called re: delivery of Botox which is scheduled to be delivered on 01-14-24 SDV, quantity -1, 30 day supply 200 units

## 2024-01-13 NOTE — Telephone Encounter (Signed)
 noted

## 2024-01-14 NOTE — Telephone Encounter (Signed)
 Received 200 units of Botox from [facility], called pt and moved appt up to tomorrow 01/15/24 with Isa Manuel.

## 2024-01-15 ENCOUNTER — Ambulatory Visit: Admitting: Neurology

## 2024-01-15 VITALS — BP 132/69

## 2024-01-15 DIAGNOSIS — G43709 Chronic migraine without aura, not intractable, without status migrainosus: Secondary | ICD-10-CM | POA: Diagnosis not present

## 2024-01-15 MED ORDER — ONABOTULINUMTOXINA 200 UNITS IJ SOLR
155.0000 [IU] | Freq: Once | INTRAMUSCULAR | Status: AC
Start: 2024-01-15 — End: 2024-01-15
  Administered 2024-01-15: 155 [IU] via INTRAMUSCULAR

## 2024-01-15 NOTE — Progress Notes (Signed)
   BOTOX  PROCEDURE NOTE FOR MIGRAINE HEADACHE  HISTORY: Jaime Burch is here for her 1st Botox .  Daily headache, 2-3 migraines weekly that can last a few days. Remains on nortriptyline  50 mg at bedtime, Emgality  for migraine prevention.  Uses Nurtec as needed.  Description of procedure:  The patient was placed in a sitting position. The standard protocol was used for Botox  as follows, with 5 units of Botox  injected at each site:   -Procerus muscle, midline injection  -Corrugator muscle, bilateral injection  -Frontalis muscle, bilateral injection, with 2 sites each side, medial injection was performed in the upper one third of the frontalis muscle, in the region vertical from the medial inferior edge of the superior orbital rim. The lateral injection was again in the upper one third of the forehead vertically above the lateral limbus of the cornea, 1.5 cm lateral to the medial injection site.  -Temporalis muscle injection, 4 sites, bilaterally. The first injection was 3 cm above the tragus of the ear, second injection site was 1.5 cm to 3 cm up from the first injection site in line with the tragus of the ear. The third injection site was 1.5-3 cm forward between the first 2 injection sites. The fourth injection site was 1.5 cm posterior to the second injection site.  -Occipitalis muscle injection, 3 sites, bilaterally. The first injection was done one half way between the occipital protuberance and the tip of the mastoid process behind the ear. The second injection site was done lateral and superior to the first, 1 fingerbreadth from the first injection. The third injection site was 1 fingerbreadth superiorly and medially from the first injection site.  -Cervical paraspinal muscle injection, 2 sites, bilateral, the first injection site was 1 cm from the midline of the cervical spine, 3 cm inferior to the lower border of the occipital protuberance. The second injection site was 1.5 cm superiorly  and laterally to the first injection site.  -Trapezius muscle injection was performed at 3 sites, bilaterally. The first injection site was in the upper trapezius muscle halfway between the inflection point of the neck, and the acromion. The second injection site was one half way between the acromion and the first injection site. The third injection was done between the first injection site and the inflection point of the neck.  A 200 unit bottle of Botox  was used, 155 units were injected, the rest of the Botox  was wasted. The patient tolerated the procedure well, there were no complications of the above procedure.  Botox  NDC 1610-9604-54 Lot number [id number] Expiration date 05/2026 SP

## 2024-01-15 NOTE — Progress Notes (Signed)
 Botox - 200 units x 1 vial Lot: [id number] Expiration: 2027/09 NDC: 0023-3921-02  Bacteriostatic 0.9% Sodium Chloride- 4 mL  Lot: [id number] Expiration: 07/17/24 NDC: 0454098119  Dx:  J47.829  S/P  Witnessed by Gerardine Knock RN

## 2024-01-28 ENCOUNTER — Ambulatory Visit: Admitting: Neurology

## 2024-02-02 ENCOUNTER — Ambulatory Visit: Admitting: Neurology

## 2024-03-25 ENCOUNTER — Telehealth: Payer: Self-pay | Admitting: Neurology

## 2024-03-25 ENCOUNTER — Encounter: Payer: Self-pay | Admitting: Neurology

## 2024-03-25 NOTE — Telephone Encounter (Signed)
 Due to being out of town pt needed to r/s her Botox 

## 2024-04-01 ENCOUNTER — Other Ambulatory Visit: Payer: Self-pay | Admitting: Neurology

## 2024-04-02 ENCOUNTER — Encounter: Payer: Self-pay | Admitting: Neurology

## 2024-04-14 ENCOUNTER — Ambulatory Visit: Admitting: Neurology

## 2024-04-18 ENCOUNTER — Other Ambulatory Visit: Payer: Self-pay | Admitting: Neurology

## 2024-04-19 ENCOUNTER — Encounter: Payer: Self-pay | Admitting: Neurology

## 2024-04-19 ENCOUNTER — Ambulatory Visit: Admitting: Neurology

## 2024-04-19 VITALS — BP 151/74

## 2024-04-19 DIAGNOSIS — G43709 Chronic migraine without aura, not intractable, without status migrainosus: Secondary | ICD-10-CM | POA: Diagnosis not present

## 2024-04-19 MED ORDER — ONABOTULINUMTOXINA 200 UNITS IJ SOLR
155.0000 [IU] | Freq: Once | INTRAMUSCULAR | Status: AC
  Administered 2024-04-19: 155 [IU] via INTRAMUSCULAR

## 2024-04-19 MED ORDER — NURTEC 75 MG PO TBDP: 75.0000 mg | ORAL_TABLET | ORAL | 11 refills | Status: DC | PRN

## 2024-04-19 MED ORDER — PREDNISONE 5 MG PO TABS
ORAL_TABLET | ORAL | 0 refills | Status: DC
Start: 2024-04-19 — End: 2024-07-15

## 2024-04-19 NOTE — Progress Notes (Signed)
   BOTOX  PROCEDURE NOTE FOR MIGRAINE HEADACHE   HISTORY: Jaime Burch is here for Botox . This will be her 2nd round. See office note.   Description of procedure:  The patient was placed in a sitting position. The standard protocol was used for Botox  as follows, with 5 units of Botox  injected at each site:   -Procerus muscle, midline injection  -Corrugator muscle, bilateral injection  -Frontalis muscle, bilateral injection, with 2 sites each side, medial injection was performed in the upper one third of the frontalis muscle, in the region vertical from the medial inferior edge of the superior orbital rim. The lateral injection was again in the upper one third of the forehead vertically above the lateral limbus of the cornea, 1.5 cm lateral to the medial injection site.  -Temporalis muscle injection, 4 sites, bilaterally. The first injection was 3 cm above the tragus of the ear, second injection site was 1.5 cm to 3 cm up from the first injection site in line with the tragus of the ear. The third injection site was 1.5-3 cm forward between the first 2 injection sites. The fourth injection site was 1.5 cm posterior to the second injection site.  -Occipitalis muscle injection, 3 sites, bilaterally. The first injection was done one half way between the occipital protuberance and the tip of the mastoid process behind the ear. The second injection site was done lateral and superior to the first, 1 fingerbreadth from the first injection. The third injection site was 1 fingerbreadth superiorly and medially from the first injection site.  -Cervical paraspinal muscle injection, 2 sites, bilateral, the first injection site was 1 cm from the midline of the cervical spine, 3 cm inferior to the lower border of the occipital protuberance. The second injection site was 1.5 cm superiorly and laterally to the first injection site.  -Trapezius muscle injection was performed at 3 sites, bilaterally. The first  injection site was in the upper trapezius muscle halfway between the inflection point of the neck, and the acromion. The second injection site was one half way between the acromion and the first injection site. The third injection was done between the first injection site and the inflection point of the neck.   A 200 unit bottle of Botox  was used, 155 units were injected, the rest of the Botox  was wasted. The patient tolerated the procedure well, there were no complications of the above procedure.  Botox  NDC 9976-6078-97 Lot number [id number] Expiration date 10/2026 SP

## 2024-04-19 NOTE — Progress Notes (Signed)
 Botox - 200 units x 1 vial Lot: [id number] Expiration: 2028/02 NDC: 0023-3921-02  Bacteriostatic 0.9% Sodium Chloride- 4 mL  Lot: [id number] Expiration: 07/16/25 NDC: 9590803397  Dx:  H56.290  S/P  Witnessed by DELENA MOLT RN

## 2024-04-19 NOTE — Progress Notes (Signed)
 Patient: Jaime Burch Date of Birth: September 16, 1976  Reason for Visit: Follow up History from: Patient Primary Neurologist: Dr.Yan   ASSESSMENT AND PLAN 48 y.o. year old female   1.  Chronic migraine headache with right sensory deficit for the last 2 weeks to face, arm, leg. 1st Botox  01/15/24 with excellent benefit. Subtle pupil asymmetry. -Right sided sensory deficit, similar presentation as last year went to the ER April 2024, I ordered MRI brain with and without contrast that was normal. Mentions noted pupil abnormality, left 4 mm, right 3.5 mm  -Proceed with Botox , will be her 2nd round -Check CTA head and neck given sensory deficit, pupil abnormality  -Hold off on repeating MRI brain, since similar presentation last year, MRI brain was normal. No overt weakness on exam  - Continue Emgality  for migraine prevention for now, along with nortriptyline  50 mg at bedtime - Use Nurtec 75 mg as needed for acute headache, hold triptan for now given neuro symptoms with migraine - I sent her a prednisone  taper pack to have on hand in the event of prolonged migraine headache  -Previously tried and failed: nortriptyline , Aimovig , Imitrex , Maxalt , insurance denied Ajovy   HISTORY  Jaime Burch, is a 48 year old female, seen in request by her primary care doctor Georgina Alm FALCON.  for evaluation of right facial pain, initial evaluation was on May 29, 2021   I reviewed and summarized the referring note. PMHX. Kidney stone.   She denies a previous history of migraine headaches, on February 24, 2021, she woke up noticed sore throat, ear pain, and right side pressure, 2 weeks later, she felt more dull achy pain at the right face, felt heavy on the right side, also felt right arm, leg numbness, she drove herself to urgent care, had MRI of the brain with without contrast May 09, 2021, no significant abnormality, few small vessel disease, vascular loop abuts the mid cisternal segment of the right  trigeminal nerve, and right cochlear nerve  Since then, she had intermittent right facial abnormal sensation, but denies radiating pain, she also describes right-sided headache with nausea, light noise sensitivity.   She had a history of shingles, involving right C1-C2   UPDATE Jan 5th 2023: She is not taking nortriptyline  10+25 mg every night, reported 50% improvement, much less headache, less severe, but she continue complains of side effect with nortriptyline , dry mouth, worsening constipation,  About once a week she use Imitrex  for moderate to severe headaches, which does help her much better with over-the-counter medications, only few times she has to take second dose, she does have nausea with most severe headaches, sometimes preceded by blurry vision before the onset of the headache  We personally reviewed MRI of the brain with without contrast from [facility], no acute intracranial abnormality  Update April 01, 2022 SS: Aimovig  was started 70 mg 09/20/21, increased to 140 mg May 2023. Things are much better. Still has some sensation of right side face feeling different, it hasn't worsened, has not had any visual change/nausea. Takes Imitrex , often 100 mg at 1 time, will help, 50 mg wasn't enough. Laying down helps. Still on nortriptyline  25 mg, is able to tolerate. Hasn't needed Imitrex  since May.   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 I reviewed the ER visit 01/11/23, presented with headache with numbness/tingling/burning to the entire right side.  She was given IV Compazine ,  Benadryl .  CT head was normal.  CBC was normal, potassium 3.2. IV medication made her feel crazy, couldn't be still, helped with nausea, knew couldn't cooperate for MRI. In October 2023, started on tamoxifen, estrogen levels high, stopped it this week due to uterine polyp. In Jan, messaged about constipation, still on  nortriptyline  25 mg,  was on Aimovig  140 mg. Last took on March 15 th. We tried to switch to Emgality  for less constipation. Was taking Nurtec 75 every other day to bridge until insurance approved Emgality . Insurance denied Ajovy .  Update April 29, 2023 SS: MRI of the brain with and without contrast May 2024 was normal. On Emgality , insurance is covering for now, 3 migraines a month. Much better. Still on nortriptyline  50 mg at bedtime. Nurtec works well for her. She is very pleased. Is off tamoxifen.   Update December 25, 2023 SS: Remains on Emgality , Nortriptyline  50 mg daily, uses Nurtec PRN. Hormone factor contributing to migraines, periods are unreliable. Having daily headache, 2-3 migraines a week. Uses all Nurtec # 8 in the past. Tried sample of Ubrelvy in the past with good benefit. Had a fall in January, was having some numbness to right arm, could be left, it resolved. 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    Update April 19, 2024 SS: Here today, she feels like her right arm and right leg feel different for last 2 weeks. She had Botox  1st cycle 01/15/24 within 2 weeks, intensity of migraine was much better, still present, but much less and tolerable. The right side of her head stays in constant pressure, will take Nurtec when throbbing, nauseated. In the past few days, feels her right arm and leg feels different, like mild version of arm being asleep after sleep. Comes and goes, fluctuates. Has already walked 5 miles today. Describes this as similar episode as April 2024, but less intense.  Friend mentioned pupil change, left 4 mm, right 3.5 mm, both equally reactive to light.   REVIEW OF SYSTEMS: Out of a complete 14 system review of symptoms, the patient complains only of the following symptoms, and all other reviewed systems are negative.  See HPI  ALLERGIES: Allergies  Allergen Reactions   Morphine Other (See Comments), Shortness Of Breath and Palpitations    Other reaction(s): Hypertension  (intolerance) Tachycardia, SOB Tachycardia, SOB     HOME MEDICATIONS: Outpatient Medications Prior to Visit  Medication Sig Dispense Refill   botulinum toxin Type A  (BOTOX ) 200 units injection Inject 200 Units into the muscle every 3 (three) months. 1 each 2   chlorthalidone (HYGROTON) 25 MG tablet Take 25 mg by mouth daily.     Cholecalciferol (VITAMIN D) 50 MCG (2000 UT) CAPS Take 2,000 Units by mouth daily.     Galcanezumab -gnlm (EMGALITY ) 120 MG/ML SOAJ INJECT 240 MG INTO THE SKIN ONCE FOR 1 DOSE. 1 mL 11   nortriptyline  (PAMELOR ) 50 MG capsule Take 50 mg by mouth at bedtime.     Omega-3 Fatty Acids (FISH OIL PO) Take by mouth.     ondansetron  (ZOFRAN -ODT) 4 MG disintegrating tablet Take 1 tablet (4 mg total) by mouth every 8 (eight) hours as needed for nausea or vomiting. 20 tablet 6   pantoprazole (PROTONIX) 40 MG tablet Take 40 mg by mouth daily.     Potassium Bicarbonate (KLOR-CON/EF PO) Take 1 tablet by mouth daily. 60 mg     Probiotic Product (PROBIOTIC ADVANCED PO) Take by mouth.     SUMAtriptan  (IMITREX ) 100 MG tablet May repeat in  2 hours if headache persists or recurs. No more than 2 tablets in 24 hours 9 tablet 3   Rimegepant Sulfate (NURTEC) 75 MG TBDP Take 1 tablet (75 mg total) by mouth as needed (take 1 tablet at onset, max is 1 tablet in 24 hours). 8 tablet 11   No facility-administered medications prior to visit.    PAST MEDICAL HISTORY: Past Medical History:  Diagnosis Date   Arthritis    Chronic back pain    Headache    migraine   Kidney stones    Tinnitus     PAST SURGICAL HISTORY: Past Surgical History:  Procedure Laterality Date   BREAST SURGERY     CESAREAN SECTION     CHOLECYSTECTOMY     DILATION AND CURETTAGE OF UTERUS  12/2022   hysteroscopy with polypectomy  12/2022    FAMILY HISTORY: Family History  Problem Relation Age of Onset   Breast cancer Maternal Aunt    Lupus Maternal Grandmother    Heart disease Maternal Grandfather      SOCIAL HISTORY: Social History   Socioeconomic History   Marital status: Divorced    Spouse name: Not on file   Number of children: 1   Years of education: Not on file   Highest education level: Master's degree (e.g., MA, MS, MEng, MEd, MSW, MBA)  Occupational History   Not on file  Tobacco Use   Smoking status: Never   Smokeless tobacco: Never  Vaping Use   Vaping status: Never Used  Substance and Sexual Activity   Alcohol use: Never   Drug use: Never   Sexual activity: Not Currently  Other Topics Concern   Not on file  Social History Narrative   Not on file   Social Drivers of Health   Financial Resource Strain: Not on file  Food Insecurity: Low Risk  (09/28/2023)   Received from [facility]   Hunger Vital Sign    Within the past 12 months, you worried that your food would run out before you got money to buy more: Never true    Within the past 12 months, the food you bought just didn't last and you didn't have money to get more. : Never true  Transportation Needs: No Transportation Needs (09/28/2023)   Received from [facility]    In the past 12 months, has lack of reliable transportation kept you from medical appointments, meetings, work or from getting things needed for daily living? : No  Physical Activity: Not on file  Stress: Not on file  Social Connections: Not on file  Intimate Partner Violence: Not on file   PHYSICAL EXAM  Vitals:   04/19/24 1542  BP: (!) 151/74   There is no height or weight on file to calculate BMI.  Generalized: Well developed, in no acute distress  Neurological examination  Mentation: Alert oriented to time, place, history taking. Follows all commands speech and language fluent Cranial nerve II-XII: Subtle left pupil 4 mm, right 3.5 mm, but equally reactive to light. Extraocular movements were full, visual field were full on confrontational test. Facial sensation and strength were normal. Head turning and  shoulder shrug  were normal and symmetric. Motor: The motor testing reveals 5 over 5 strength of all 4 extremities. Good symmetric motor tone is noted throughout.  Sensory: Decreased soft touch sensation to the right face, arm, leg Coordination: Cerebellar testing reveals good finger-nose-finger and heel-to-shin bilaterally.  Gait and station: Gait is normal.  Reflexes: Deep  tendon reflexes are symmetric and normal bilaterally.   DIAGNOSTIC DATA (LABS, IMAGING, TESTING) - I reviewed patient records, labs, notes, testing and imaging myself where available.  Lab Results  Component Value Date   WBC 4.4 01/11/2023   HGB 13.3 01/11/2023   HCT 38.5 01/11/2023   MCV 89.7 01/11/2023   PLT 206 01/11/2023      Component Value Date/Time   NA 140 01/11/2023 1337   K 3.2 (L) 01/11/2023 1337   CL 103 01/11/2023 1337   CO2 28 01/11/2023 1337   GLUCOSE 100 (H) 01/11/2023 1337   BUN 8 01/11/2023 1337   CREATININE 0.54 01/11/2023 1337   CALCIUM 9.3 01/11/2023 1337   PROT 6.3 (L) 01/11/2023 1337   ALBUMIN 4.1 01/11/2023 1337   AST 15 01/11/2023 1337   ALT 10 01/11/2023 1337   ALKPHOS 32 (L) 01/11/2023 1337   BILITOT 0.4 01/11/2023 1337   GFRNONAA >60 01/11/2023 1337   No results found for: CHOL, HDL, LDLCALC, LDLDIRECT, TRIG, CHOLHDL No results found for: YHAJ8R No results found for: VITAMINB12 No results found for: TSH  Lauraine Born, AGNP-C, DNP 04/19/2024, 4:28 PM [facility] [address] [place], [place] [address] [phone]

## 2024-04-20 ENCOUNTER — Telehealth: Payer: Self-pay | Admitting: Neurology

## 2024-04-20 ENCOUNTER — Encounter: Payer: Self-pay | Admitting: Neurology

## 2024-04-20 NOTE — Telephone Encounter (Signed)
 CHARON barrows: [id number] exp. 04/20/24-05/19/24 sent to GI [phone]

## 2024-04-22 ENCOUNTER — Ambulatory Visit
Admission: RE | Admit: 2024-04-22 | Discharge: 2024-04-22 | Disposition: A | Discharge status: Home / Self Care | Source: Ambulatory Visit | Attending: Neurology | Admitting: Neurology

## 2024-04-22 DIAGNOSIS — G43709 Chronic migraine without aura, not intractable, without status migrainosus: Secondary | ICD-10-CM

## 2024-04-22 MED ORDER — IOPAMIDOL (ISOVUE-370) INJECTION 76%
80.0000 mL | Freq: Once | INTRAVENOUS | Status: AC | PRN
  Administered 2024-04-22: 80 mL via INTRAVENOUS

## 2024-04-26 ENCOUNTER — Encounter: Payer: Self-pay | Admitting: Neurology

## 2024-04-27 ENCOUNTER — Ambulatory Visit: Payer: Self-pay | Admitting: Neurology

## 2024-04-28 ENCOUNTER — Ambulatory Visit: Payer: BC Managed Care – PPO | Admitting: Neurology

## 2024-04-30 ENCOUNTER — Telehealth: Payer: Self-pay | Admitting: Pharmacist

## 2024-04-30 NOTE — Telephone Encounter (Signed)
 Pharmacy Patient Advocate Encounter   Received notification from CoverMyMeds that prior authorization for Emgality  120MG /ML auto-injectors (migraine) is required/requested.   Insurance verification completed.   The patient is insured through [facility] .   Per test claim: PA required; PA submitted to above mentioned insurance via Latent Key/confirmation #/EOC [facility] Status is pending

## 2024-05-03 NOTE — Telephone Encounter (Signed)
 Pharmacy Patient Advocate Encounter  Received notification from [facility] that Prior Authorization for Emgality  120MG /ML auto-injectors (migraine)  has been APPROVED from 04/30/2024 to 04/30/2025   PA #/Case ID/Reference #: [id number]

## 2024-06-02 ENCOUNTER — Other Ambulatory Visit: Payer: Self-pay | Admitting: Neurology

## 2024-06-14 ENCOUNTER — Telehealth: Payer: Self-pay | Admitting: Neurology

## 2024-06-14 NOTE — Telephone Encounter (Signed)
 Submitted auth renewal request via CMM, status is pending. Key: [id number]

## 2024-06-28 NOTE — Telephone Encounter (Signed)
 Received approval, pt will continue to fill through [facility] SP.  Auth#: [id number] (06/14/24-05/16/25)

## 2024-07-02 ENCOUNTER — Telehealth: Payer: Self-pay | Admitting: Pharmacist

## 2024-07-02 ENCOUNTER — Other Ambulatory Visit (HOSPITAL_COMMUNITY): Payer: Self-pay

## 2024-07-02 NOTE — Telephone Encounter (Signed)
 Pharmacy Patient Advocate Encounter   Received notification from CoverMyMeds that prior authorization for Nurtec 75MG  dispersible tablets is required/requested.   Insurance verification completed.   The patient is insured through [facility].   Per test claim: PA required; PA submitted to above mentioned insurance via Latent Key/confirmation #/EOC [id number] Status is pending

## 2024-07-15 ENCOUNTER — Encounter: Payer: Self-pay | Admitting: Neurology

## 2024-07-15 ENCOUNTER — Ambulatory Visit: Admitting: Neurology

## 2024-07-15 VITALS — BP 113/80 | Wt 135.0 lb

## 2024-07-15 DIAGNOSIS — G43709 Chronic migraine without aura, not intractable, without status migrainosus: Secondary | ICD-10-CM

## 2024-07-15 MED ORDER — ONABOTULINUMTOXINA 200 UNITS IJ SOLR
155.0000 [IU] | Freq: Once | INTRAMUSCULAR | Status: AC
  Administered 2024-07-15: 155 [IU] via INTRAMUSCULAR

## 2024-07-15 NOTE — Progress Notes (Signed)
   BOTOX  PROCEDURE NOTE FOR MIGRAINE HEADACHE  HISTORY: Jaime Burch is here for Botox . Last was 04/19/24 with me. CTA head and neck were normal. Remains on Emgality  and Nurtec. Migraines do great with Botox , needs Nurtec during the last 2 weeks. The right sided sensory issues resolved when Botox  was working great.   Description of procedure:  The patient was placed in a sitting position. The standard protocol was used for Botox  as follows, with 5 units of Botox  injected at each site:   -Procerus muscle, midline injection  -Corrugator muscle, bilateral injection  -Frontalis muscle, bilateral injection, with 2 sites each side, medial injection was performed in the upper one third of the frontalis muscle, in the region vertical from the medial inferior edge of the superior orbital rim. The lateral injection was again in the upper one third of the forehead vertically above the lateral limbus of the cornea, 1.5 cm lateral to the medial injection site.  -Temporalis muscle injection, 4 sites, bilaterally. The first injection was 3 cm above the tragus of the ear, second injection site was 1.5 cm to 3 cm up from the first injection site in line with the tragus of the ear. The third injection site was 1.5-3 cm forward between the first 2 injection sites. The fourth injection site was 1.5 cm posterior to the second injection site.  -Occipitalis muscle injection, 3 sites, bilaterally. The first injection was done one half way between the occipital protuberance and the tip of the mastoid process behind the ear. The second injection site was done lateral and superior to the first, 1 fingerbreadth from the first injection. The third injection site was 1 fingerbreadth superiorly and medially from the first injection site.  -Cervical paraspinal muscle injection, 2 sites, bilateral, the first injection site was 1 cm from the midline of the cervical spine, 3 cm inferior to the lower border of the occipital  protuberance. The second injection site was 1.5 cm superiorly and laterally to the first injection site.  -Trapezius muscle injection was performed at 3 sites, bilaterally. The first injection site was in the upper trapezius muscle halfway between the inflection point of the neck, and the acromion. The second injection site was one half way between the acromion and the first injection site. The third injection was done between the first injection site and the inflection point of the neck.   A 200 unit bottle of Botox  was used, 155 units were injected, the rest of the Botox  was wasted. The patient tolerated the procedure well, there were no complications of the above procedure.  Botox  NDC 9976-6078-97 Lot number [id number] Expiration date 02/2026 SP

## 2024-07-15 NOTE — Progress Notes (Signed)
 Botox -200U x 1vial Lot: [id number] Exp: 02/2026 NDC: 9976-6078-97  Bacteriostatic 0.9% Sodium Chloride- 4mL total Onu:[id number] Expiration: 07/16/2025 NDC: 9590-8033-97  Specialty pharmacy  Witnessed by: Nevelyn JULIANNA LATHER

## 2024-07-24 ENCOUNTER — Encounter: Payer: Self-pay | Admitting: Neurology

## 2024-07-27 MED ORDER — NORTRIPTYLINE HCL 25 MG PO CAPS: 50.0000 mg | ORAL_CAPSULE | Freq: Every day | ORAL | 3 refills | Status: AC

## 2024-08-25 ENCOUNTER — Other Ambulatory Visit: Payer: Self-pay | Admitting: Neurology

## 2024-08-25 NOTE — Telephone Encounter (Signed)
 Last seen on 07/15/24 Follow up scheduled on 10/13/24

## 2024-09-13 DIAGNOSIS — R894 Abnormal immunological findings in specimens from other organs, systems and tissues: Secondary | ICD-10-CM | POA: Insufficient documentation

## 2024-09-30 MED ORDER — ONABOTULINUMTOXINA 200 UNITS IJ SOLR: 200.0000 [IU] | INTRAMUSCULAR | 3 refills | Status: AC

## 2024-09-30 NOTE — Telephone Encounter (Signed)
 Refilled as requested

## 2024-09-30 NOTE — Telephone Encounter (Signed)
 Please send Botox  refills to [facility], thank you!

## 2024-09-30 NOTE — Addendum Note (Signed)
 Addended by: ONEITA NEVELYN BRAVO on: 09/30/2024 09:02 AM   Modules accepted: Orders

## 2024-10-13 ENCOUNTER — Encounter: Payer: Self-pay | Admitting: Neurology

## 2024-10-13 ENCOUNTER — Ambulatory Visit: Admitting: Neurology

## 2024-10-14 DIAGNOSIS — Z0289 Encounter for other administrative examinations: Secondary | ICD-10-CM

## 2024-10-14 NOTE — Telephone Encounter (Signed)
 Pt has been rescheduled

## 2024-10-20 ENCOUNTER — Ambulatory Visit: Admitting: Neurology

## 2024-10-20 ENCOUNTER — Encounter: Payer: Self-pay | Admitting: Neurology

## 2024-10-20 ENCOUNTER — Other Ambulatory Visit: Payer: Self-pay | Admitting: Neurology

## 2024-10-20 VITALS — BP 135/87

## 2024-10-20 DIAGNOSIS — G43709 Chronic migraine without aura, not intractable, without status migrainosus: Secondary | ICD-10-CM | POA: Diagnosis not present

## 2024-10-20 MED ORDER — ONABOTULINUMTOXINA 200 UNITS IJ SOLR
155.0000 [IU] | Freq: Once | INTRAMUSCULAR | Status: AC
  Administered 2024-10-20: 155 [IU] via INTRAMUSCULAR

## 2024-10-20 NOTE — Progress Notes (Signed)
" ° °  BOTOX  PROCEDURE NOTE FOR MIGRAINE HEADACHE  HISTORY: Jaime Burch is here for Botox . Last was 07/15/24 with me. Botox  is doing great for her! Remains on Emgality , she is taking Nurtec at least 50% less. Also gets cosmetic Botox .    Description of procedure:  The patient was placed in a sitting position. The standard protocol was used for Botox  as follows, with 5 units of Botox  injected at each site:   -Procerus muscle, midline injection  -Corrugator muscle, bilateral injection  -Frontalis muscle, bilateral injection, with 2 sites each side, medial injection was performed in the upper one third of the frontalis muscle, in the region vertical from the medial inferior edge of the superior orbital rim. The lateral injection was again in the upper one third of the forehead vertically above the lateral limbus of the cornea, 1.5 cm lateral to the medial injection site.  -Temporalis muscle injection, 4 sites, bilaterally. The first injection was 3 cm above the tragus of the ear, second injection site was 1.5 cm to 3 cm up from the first injection site in line with the tragus of the ear. The third injection site was 1.5-3 cm forward between the first 2 injection sites. The fourth injection site was 1.5 cm posterior to the second injection site.  -Occipitalis muscle injection, 3 sites, bilaterally. The first injection was done one half way between the occipital protuberance and the tip of the mastoid process behind the ear. The second injection site was done lateral and superior to the first, 1 fingerbreadth from the first injection. The third injection site was 1 fingerbreadth superiorly and medially from the first injection site.  -Cervical paraspinal muscle injection, 2 sites, bilateral, the first injection site was 1 cm from the midline of the cervical spine, 3 cm inferior to the lower border of the occipital protuberance. The second injection site was 1.5 cm superiorly and laterally to the first  injection site.  -Trapezius muscle injection was performed at 3 sites, bilaterally. The first injection site was in the upper trapezius muscle halfway between the inflection point of the neck, and the acromion. The second injection site was one half way between the acromion and the first injection site. The third injection was done between the first injection site and the inflection point of the neck.   A 200 unit bottle of Botox  was used, 155 units were injected, the rest of the Botox  was wasted. The patient tolerated the procedure well, there were no complications of the above procedure.  Botox  NDC 9976-6078-97 Lot number [id number] Expiration date 11/2026 SP   "

## 2024-10-20 NOTE — Progress Notes (Signed)
 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

## 2025-01-12 ENCOUNTER — Ambulatory Visit: Admitting: Neurology

## 2025-01-25 ENCOUNTER — Ambulatory Visit: Admitting: Neurology
